# Patient Record
Sex: Female | Born: 1970 | Race: White | Hispanic: No | Marital: Married | Smoking: Former smoker
Health system: Southern US, Community
[De-identification: ages and names within clinical notes are randomized; demographics above are authoritative.]

## PROBLEM LIST (undated history)

## (undated) VITALS — BP 107/70 | HR 78 | Temp 98.4°F | Resp 16 | Ht 66.0 in | Wt 155.0 lb

## (undated) DIAGNOSIS — M25559 Pain in unspecified hip: Secondary | ICD-10-CM

## (undated) DIAGNOSIS — F329 Major depressive disorder, single episode, unspecified: Secondary | ICD-10-CM

## (undated) DIAGNOSIS — M5136 Other intervertebral disc degeneration, lumbar region: Secondary | ICD-10-CM

## (undated) DIAGNOSIS — F319 Bipolar disorder, unspecified: Secondary | ICD-10-CM

## (undated) DIAGNOSIS — F32A Depression, unspecified: Secondary | ICD-10-CM

## (undated) DIAGNOSIS — G8929 Other chronic pain: Secondary | ICD-10-CM

## (undated) HISTORY — PX: ABDOMINAL HYSTERECTOMY: SHX81

## (undated) HISTORY — PX: BACK SURGERY: SHX140

## (undated) HISTORY — DX: Other intervertebral disc degeneration, lumbar region: M51.36

## (undated) HISTORY — PX: OTHER SURGICAL HISTORY: SHX169

## (undated) HISTORY — PX: NO PAST SURGERIES: SHX2092

---

## 1997-11-22 ENCOUNTER — Emergency Department (HOSPITAL_COMMUNITY): Admission: EM | Admit: 1997-11-22 | Discharge: 1997-11-22 | Payer: Self-pay | Admitting: Emergency Medicine

## 1999-01-27 ENCOUNTER — Emergency Department (HOSPITAL_COMMUNITY): Admission: EM | Admit: 1999-01-27 | Discharge: 1999-01-27 | Payer: Self-pay | Admitting: Emergency Medicine

## 1999-03-31 ENCOUNTER — Emergency Department (HOSPITAL_COMMUNITY): Admission: EM | Admit: 1999-03-31 | Discharge: 1999-03-31 | Payer: Self-pay | Admitting: Emergency Medicine

## 1999-03-31 ENCOUNTER — Encounter: Payer: Self-pay | Admitting: *Deleted

## 1999-05-01 ENCOUNTER — Other Ambulatory Visit: Admission: RE | Admit: 1999-05-01 | Discharge: 1999-05-01 | Payer: Self-pay | Admitting: Obstetrics and Gynecology

## 1999-07-30 ENCOUNTER — Emergency Department (HOSPITAL_COMMUNITY): Admission: EM | Admit: 1999-07-30 | Discharge: 1999-07-30 | Payer: Self-pay | Admitting: Emergency Medicine

## 1999-12-08 ENCOUNTER — Emergency Department (HOSPITAL_COMMUNITY): Admission: EM | Admit: 1999-12-08 | Discharge: 1999-12-08 | Payer: Self-pay | Admitting: Emergency Medicine

## 1999-12-09 ENCOUNTER — Encounter: Payer: Self-pay | Admitting: Emergency Medicine

## 2000-05-21 ENCOUNTER — Other Ambulatory Visit: Admission: RE | Admit: 2000-05-21 | Discharge: 2000-05-21 | Payer: Self-pay | Admitting: Obstetrics and Gynecology

## 2001-09-23 ENCOUNTER — Emergency Department (HOSPITAL_COMMUNITY): Admission: EM | Admit: 2001-09-23 | Discharge: 2001-09-23 | Payer: Self-pay | Admitting: Emergency Medicine

## 2001-09-23 ENCOUNTER — Encounter: Payer: Self-pay | Admitting: Emergency Medicine

## 2001-10-09 ENCOUNTER — Other Ambulatory Visit: Admission: RE | Admit: 2001-10-09 | Discharge: 2001-10-09 | Payer: Self-pay | Admitting: Obstetrics and Gynecology

## 2004-07-03 ENCOUNTER — Other Ambulatory Visit: Admission: RE | Admit: 2004-07-03 | Discharge: 2004-07-03 | Payer: Self-pay | Admitting: Obstetrics and Gynecology

## 2004-10-21 ENCOUNTER — Emergency Department (HOSPITAL_COMMUNITY): Admission: EM | Admit: 2004-10-21 | Discharge: 2004-10-21 | Payer: Self-pay | Admitting: Emergency Medicine

## 2006-12-05 ENCOUNTER — Encounter: Admission: RE | Admit: 2006-12-05 | Discharge: 2006-12-05 | Payer: Self-pay | Admitting: Internal Medicine

## 2010-05-11 ENCOUNTER — Ambulatory Visit: Payer: Self-pay | Admitting: Obstetrics and Gynecology

## 2010-06-13 ENCOUNTER — Ambulatory Visit: Payer: Self-pay | Admitting: Unknown Physician Specialty

## 2010-06-27 ENCOUNTER — Ambulatory Visit: Payer: Self-pay | Admitting: Unknown Physician Specialty

## 2010-10-05 NOTE — Op Note (Signed)
[name], [name]                ACCOUNT NO.:  [id number]   MEDICAL RECORD NO.:  [id number]          PATIENT TYPE:  EMS   LOCATION:  MAJO                         FACILITY:  [facility]   PHYSICIAN:  John C. Madilyn Fireman, M.D.    DATE OF BIRTH:  17-Mar-1971   DATE OF PROCEDURE:  10/21/2004  DATE OF DISCHARGE:                                 OPERATIVE REPORT   PROCEDURES:  1.  esophagogastroduodenoscopy  2.  Esophageal dilatation.   INDICATIONS FOR PROCEDURE:  Sensation of esophageal obstruction after eating  steak.   DESCRIPTION OF PROCEDURE:  The patient was placed in the left lateral  decubitus position and placed on the pulse monitor with continuous low-flow  oxygen delivered by nasal cannula. She was sedated 50 mcg of IV fentanyl and  5 mg of IV Versed. The Olympus video endoscope was advanced under direct  vision into the oropharynx and esophagus. The esophagus was straight and of  normal caliber with the squamocolumnar line at 38 cm. There was no food seen  in the esophagus. There was a fairly patent lower esophageal ring. There was  no significant inflammation or stricture. The stomach was entered and a food  bolus consistent with a piece of steak was found in the dependent portions  of the fundus. Retroflexed view of the cardia was unremarkable. The fundus,  body, antrum and pylorus all appeared normal. The duodenum was entered both  bulb and second portion were well inspected and appeared to be within normal  limits. A Savary dilator was passed was passed through the endoscope channel  and the scope withdrawn. A single 17 mm Savary dilator was passed over the  guidewire with mild resistance and no blood seen on withdrawal. The patient  was then returned to the recovery room in stable condition. She tolerated  the procedure well and there were no immediate complications.   IMPRESSION:  Lower esophageal ring with evidence of previously impacted food  bolus having passed spontaneously  into the stomach, status post dilatation  to 17 mm.   PLAN:  Advance diet and observe for response to dilatation.      JCH/MEDQ  D:  10/21/2004  T:  10/21/2004  Job:  [id number]

## 2011-05-10 ENCOUNTER — Ambulatory Visit: Payer: Self-pay | Admitting: Obstetrics and Gynecology

## 2012-03-07 ENCOUNTER — Emergency Department: Payer: Self-pay | Admitting: Emergency Medicine

## 2012-03-07 LAB — CBC
MCH: 33.5 pg (ref 26.0–34.0)
MCV: 99 fL (ref 80–100)
Platelet: 283 10*3/uL (ref 150–440)
RBC: 3.95 10*6/uL (ref 3.80–5.20)
RDW: 13.4 % (ref 11.5–14.5)
WBC: 6.5 10*3/uL (ref 3.6–11.0)

## 2012-03-07 LAB — DRUG SCREEN, URINE
Amphetamines, Ur Screen: NEGATIVE (ref ?–1000)
Barbiturates, Ur Screen: NEGATIVE (ref ?–200)
Benzodiazepine, Ur Scrn: NEGATIVE (ref ?–200)
Cannabinoid 50 Ng, Ur ~~LOC~~: NEGATIVE (ref ?–50)
Methadone, Ur Screen: NEGATIVE (ref ?–300)
Opiate, Ur Screen: POSITIVE (ref ?–300)
Phencyclidine (PCP) Ur S: NEGATIVE (ref ?–25)

## 2012-03-07 LAB — COMPREHENSIVE METABOLIC PANEL
Albumin: 4.1 g/dL (ref 3.4–5.0)
Alkaline Phosphatase: 124 U/L (ref 50–136)
Anion Gap: 7 (ref 7–16)
BUN: 4 mg/dL — ABNORMAL LOW (ref 7–18)
Bilirubin,Total: 0.3 mg/dL (ref 0.2–1.0)
Chloride: 103 mmol/L (ref 98–107)
Creatinine: 0.85 mg/dL (ref 0.60–1.30)
EGFR (African American): >60
EGFR (Non-African Amer.): >60
Glucose: 80 mg/dL (ref 65–99)
Osmolality: 275 (ref 275–301)
SGOT(AST): 39 U/L — ABNORMAL HIGH (ref 15–37)
SGPT (ALT): 60 U/L (ref 12–78)
Sodium: 140 mmol/L (ref 136–145)
Total Protein: 7.9 g/dL (ref 6.4–8.2)

## 2012-03-07 LAB — URINALYSIS, COMPLETE
Bilirubin,UR: NEGATIVE
Glucose,UR: NEGATIVE mg/dL (ref 0–75)
Ketone: NEGATIVE
Leukocyte Esterase: NEGATIVE
RBC,UR: 1 /HPF (ref 0–5)
Squamous Epithelial: 1
WBC UR: <1 /HPF (ref 0–5)

## 2012-03-07 LAB — MAGNESIUM: Magnesium: 1.6 mg/dL — ABNORMAL LOW

## 2012-03-16 ENCOUNTER — Emergency Department: Payer: Self-pay | Admitting: Emergency Medicine

## 2012-03-16 LAB — URINALYSIS, COMPLETE
Ketone: NEGATIVE
Leukocyte Esterase: NEGATIVE
Nitrite: NEGATIVE
Ph: 6 (ref 4.5–8.0)
Protein: NEGATIVE
RBC,UR: <1 /HPF (ref 0–5)
WBC UR: 1 /HPF (ref 0–5)

## 2012-03-16 LAB — COMPREHENSIVE METABOLIC PANEL
Albumin: 4.7 g/dL (ref 3.4–5.0)
Alkaline Phosphatase: 129 U/L (ref 50–136)
Anion Gap: 7 (ref 7–16)
BUN: 10 mg/dL (ref 7–18)
Calcium, Total: 9.7 mg/dL (ref 8.5–10.1)
Co2: 30 mmol/L (ref 21–32)
Creatinine: 0.81 mg/dL (ref 0.60–1.30)
EGFR (African American): >60
EGFR (Non-African Amer.): >60
Glucose: 95 mg/dL (ref 65–99)
Osmolality: 276 (ref 275–301)
SGPT (ALT): 34 U/L (ref 12–78)

## 2012-03-16 LAB — CBC
HCT: 42.7 % (ref 35.0–47.0)
MCH: 34.5 pg — ABNORMAL HIGH (ref 26.0–34.0)
MCHC: 35.2 g/dL (ref 32.0–36.0)
MCV: 98 fL (ref 80–100)
RBC: 4.36 10*6/uL (ref 3.80–5.20)
RDW: 12.9 % (ref 11.5–14.5)

## 2012-03-16 LAB — DRUG SCREEN, URINE
Amphetamines, Ur Screen: NEGATIVE (ref ?–1000)
Cannabinoid 50 Ng, Ur ~~LOC~~: NEGATIVE (ref ?–50)
Cocaine Metabolite,Ur ~~LOC~~: NEGATIVE (ref ?–300)
MDMA (Ecstasy)Ur Screen: NEGATIVE (ref ?–500)
Phencyclidine (PCP) Ur S: NEGATIVE (ref ?–25)

## 2012-03-16 LAB — TSH: Thyroid Stimulating Horm: 0.93 u[IU]/mL

## 2012-03-16 LAB — ACETAMINOPHEN LEVEL: Acetaminophen: <2 ug/mL

## 2012-03-16 LAB — SALICYLATE LEVEL: Salicylates, Serum: <1.7 mg/dL

## 2012-03-17 ENCOUNTER — Emergency Department (HOSPITAL_COMMUNITY)
Admission: EM | Admit: 2012-03-17 | Discharge: 2012-03-18 | Disposition: A | Discharge status: Other Acute Inpatient Hospital | Payer: 59 | Attending: Emergency Medicine | Admitting: Emergency Medicine

## 2012-03-17 ENCOUNTER — Encounter (HOSPITAL_COMMUNITY): Payer: Self-pay | Admitting: Emergency Medicine

## 2012-03-17 DIAGNOSIS — Z79899 Other long term (current) drug therapy: Secondary | ICD-10-CM | POA: Insufficient documentation

## 2012-03-17 DIAGNOSIS — R45851 Suicidal ideations: Secondary | ICD-10-CM | POA: Insufficient documentation

## 2012-03-17 DIAGNOSIS — Z008 Encounter for other general examination: Secondary | ICD-10-CM | POA: Insufficient documentation

## 2012-03-17 DIAGNOSIS — M25559 Pain in unspecified hip: Secondary | ICD-10-CM | POA: Insufficient documentation

## 2012-03-17 DIAGNOSIS — F319 Bipolar disorder, unspecified: Secondary | ICD-10-CM | POA: Insufficient documentation

## 2012-03-17 DIAGNOSIS — M549 Dorsalgia, unspecified: Secondary | ICD-10-CM | POA: Insufficient documentation

## 2012-03-17 DIAGNOSIS — Z87891 Personal history of nicotine dependence: Secondary | ICD-10-CM | POA: Insufficient documentation

## 2012-03-17 DIAGNOSIS — G8929 Other chronic pain: Secondary | ICD-10-CM | POA: Insufficient documentation

## 2012-03-17 HISTORY — DX: Pain in unspecified hip: M25.559

## 2012-03-17 HISTORY — DX: Other chronic pain: G89.29

## 2012-03-17 LAB — COMPREHENSIVE METABOLIC PANEL
Albumin: 4.3 g/dL (ref 3.5–5.2)
BUN: 11 mg/dL (ref 6–23)
Calcium: 9.6 mg/dL (ref 8.4–10.5)
Chloride: 99 mEq/L (ref 96–112)
Creatinine, Ser: 0.72 mg/dL (ref 0.50–1.10)
Total Bilirubin: 0.4 mg/dL (ref 0.3–1.2)
Total Protein: 8 g/dL (ref 6.0–8.3)

## 2012-03-17 LAB — CBC
HCT: 43 % (ref 36.0–46.0)
MCH: 32.9 pg (ref 26.0–34.0)
MCHC: 34 g/dL (ref 30.0–36.0)
MCV: 96.8 fL (ref 78.0–100.0)
RDW: 12.7 % (ref 11.5–15.5)

## 2012-03-17 LAB — POCT PREGNANCY, URINE: Preg Test, Ur: NEGATIVE

## 2012-03-17 LAB — SALICYLATE LEVEL: Salicylate Lvl: <2 mg/dL — ABNORMAL LOW (ref 2.8–20.0)

## 2012-03-17 LAB — ETHANOL: Alcohol, Ethyl (B): <11 mg/dL (ref 0–11)

## 2012-03-17 LAB — RAPID URINE DRUG SCREEN, HOSP PERFORMED: Benzodiazepines: POSITIVE — AB

## 2012-03-17 MED ORDER — ACETAMINOPHEN 325 MG PO TABS
650.0000 mg | ORAL_TABLET | ORAL | Status: DC | PRN
  Administered 2012-03-18: 650 mg via ORAL
  Filled 2012-03-17: qty 2

## 2012-03-17 MED ORDER — LORAZEPAM 1 MG PO TABS
1.0000 mg | ORAL_TABLET | Freq: Once | ORAL | Status: AC
  Administered 2012-03-17: 1 mg via ORAL
  Filled 2012-03-17: qty 1

## 2012-03-17 MED ORDER — CLONAZEPAM 0.5 MG PO TABS
1.0000 mg | ORAL_TABLET | Freq: Every day | ORAL | Status: DC
  Administered 2012-03-17: 1 mg via ORAL
  Filled 2012-03-17: qty 2

## 2012-03-17 MED ORDER — LAMOTRIGINE 200 MG PO TABS
400.0000 mg | ORAL_TABLET | Freq: Every day | ORAL | Status: DC
  Administered 2012-03-18: 400 mg via ORAL
  Filled 2012-03-17 (×3): qty 2

## 2012-03-17 MED ORDER — ONDANSETRON 4 MG PO TBDP
4.0000 mg | ORAL_TABLET | Freq: Once | ORAL | Status: AC
  Administered 2012-03-17: 4 mg via ORAL
  Filled 2012-03-17: qty 1

## 2012-03-17 MED ORDER — ALUM & MAG HYDROXIDE-SIMETH 200-200-20 MG/5ML PO SUSP: 30.0000 mL | ORAL | Status: DC | PRN

## 2012-03-17 MED ORDER — ESCITALOPRAM OXALATE 10 MG PO TABS
10.0000 mg | ORAL_TABLET | Freq: Every day | ORAL | Status: DC
  Administered 2012-03-18: 10 mg via ORAL
  Filled 2012-03-17: qty 1

## 2012-03-17 MED ORDER — IBUPROFEN 200 MG PO TABS
600.0000 mg | ORAL_TABLET | Freq: Three times a day (TID) | ORAL | Status: DC | PRN
  Administered 2012-03-17: 600 mg via ORAL
  Filled 2012-03-17: qty 3

## 2012-03-17 MED ORDER — ZIPRASIDONE HCL 40 MG PO CAPS
40.0000 mg | ORAL_CAPSULE | Freq: Two times a day (BID) | ORAL | Status: DC
  Administered 2012-03-18: 40 mg via ORAL
  Filled 2012-03-17: qty 1

## 2012-03-17 MED ORDER — ZIPRASIDONE MESYLATE 20 MG IM SOLR: 20.0000 mg | Freq: Once | INTRAMUSCULAR | Status: DC

## 2012-03-17 MED ORDER — ONDANSETRON HCL 8 MG PO TABS: 4.0000 mg | ORAL_TABLET | Freq: Three times a day (TID) | ORAL | Status: DC | PRN

## 2012-03-17 MED ORDER — ZOLPIDEM TARTRATE 5 MG PO TABS
5.0000 mg | ORAL_TABLET | Freq: Every evening | ORAL | Status: DC | PRN
  Administered 2012-03-17: 5 mg via ORAL
  Filled 2012-03-17: qty 1

## 2012-03-17 MED ORDER — CLONAZEPAM 0.5 MG PO TABS
0.5000 mg | ORAL_TABLET | Freq: Two times a day (BID) | ORAL | Status: DC | PRN
  Administered 2012-03-18: 0.5 mg via ORAL
  Filled 2012-03-17: qty 1

## 2012-03-17 MED ORDER — ACETAMINOPHEN 325 MG PO TABS
975.0000 mg | ORAL_TABLET | Freq: Once | ORAL | Status: AC
  Administered 2012-03-17: 975 mg via ORAL
  Filled 2012-03-17: qty 3

## 2012-03-17 MED ORDER — TIZANIDINE HCL 2 MG PO TABS: 2.0000 mg | ORAL_TABLET | Freq: Three times a day (TID) | ORAL | Status: DC | PRN

## 2012-03-17 NOTE — ED Notes (Signed)
Pt placed in blue scrubs, security called to wand pt.

## 2012-03-17 NOTE — ED Notes (Signed)
Pt states that she feels anxious and nauseous, MD Kohut notified, orders received.

## 2012-03-17 NOTE — ED Notes (Signed)
[facility] called and made aware of suicidal pt. Family at bedside.

## 2012-03-17 NOTE — ED Notes (Signed)
Faxed  [facility]work

## 2012-03-17 NOTE — ED Notes (Signed)
Meal tray ordered from dietary as requested by patient

## 2012-03-17 NOTE — ED Provider Notes (Signed)
Psychiatry consultation report reviewed. Recommended inpatient  admission. Medications ordered per psychiatry recommendations.  Raeford Razor, MD 03/17/12 [phone]

## 2012-03-17 NOTE — ED Notes (Signed)
Pt reports she has had pyschotic issues for 16 years. For about a week the pt has been experiencing sadness and hopelessness, pt reports today she started to feel like giving up and wanted to harm herself. Pt reports she has a plan to hurt herself by slicing her arms open. Pt reports she has also thought about overdoses before too. Pt had a bad anxiety attack 2 weeks ago and had to go to ED.

## 2012-03-17 NOTE — ED Provider Notes (Signed)
History     CSN: [id number]  Arrival date & time 03/17/12  1254   First MD Initiated Contact with Patient 03/17/12 1326      Chief Complaint  Patient presents with  . Suicidal  . Medical Clearance    (Consider location/radiation/quality/duration/timing/severity/associated sxs/prior treatment) HPI  Jeanette Bell is a 41 y.o. female complaining of SI with plan to slit wrists. Denies HI, hallucinations, excessive EtOH use, illicit drug use. Pt has been taking effexor as prescribed   Past Medical History  Diagnosis Date  . Chronic hip pain     left hip. treated with radiofrequency    Past Surgical History  Procedure Date  . C cection     No family history on file.  History  Substance Use Topics  . Smoking status: Current Some Day Smoker -- 0.2 packs/day    Types: Cigarettes  . Smokeless tobacco: Not on file  . Alcohol Use: Yes     4-5 wine coolers or beer/week    OB History    Grav Para Term Preterm Abortions TAB SAB Ect Mult Living                  Review of Systems  Constitutional: Negative for fever.  Respiratory: Negative for shortness of breath.   Cardiovascular: Negative for chest pain.  Gastrointestinal: Negative for nausea, vomiting, abdominal pain and diarrhea.  Psychiatric/Behavioral: Positive for suicidal ideas.  All other systems reviewed and are negative.    Allergies  Review of patient's allergies indicates no known allergies.  Home Medications   Current Outpatient Rx  Name Route Sig Dispense Refill  . ACETAMINOPHEN 500 MG PO TABS Oral Take 1,000 mg by mouth every 6 (six) hours as needed. For pain    . CLONAZEPAM 1 MG PO TABS Oral Take 1 mg by mouth every 6 (six) hours as needed. For anxiety    . OXYCODONE-ACETAMINOPHEN 7.5-325 MG PO TABS Oral Take 1 tablet by mouth every 6 (six) hours as needed. For pain    . VENLAFAXINE HCL ER 150 MG PO CP24 Oral Take 150 mg by mouth daily.      BP 126/78  Temp 98.5 F (36.9 C) (Oral)  Wt 155 lb  (70.308 kg)  SpO2 98%  Physical Exam  Nursing note and vitals reviewed. Constitutional: She is oriented to person, place, and time. She appears well-developed and well-nourished. No distress.  HENT:  Head: Normocephalic.  Mouth/Throat: Oropharynx is clear and moist.  Eyes: Conjunctivae normal and EOM are normal. Pupils are equal, round, and reactive to light.  Cardiovascular: Normal rate, regular rhythm, normal heart sounds and intact distal pulses.   Pulmonary/Chest: Effort normal and breath sounds normal. No stridor.  Abdominal: Soft. Bowel sounds are normal.  Musculoskeletal: Normal range of motion.  Neurological: She is alert and oriented to person, place, and time.  Skin: Skin is warm.  Psychiatric: She has a normal mood and affect.    ED Course  Procedures (including critical care time)  Labs Reviewed  SALICYLATE LEVEL - Abnormal; Notable for the following:    Salicylate Lvl <2.0 (*)     All other components within normal limits  URINE RAPID DRUG SCREEN (HOSP PERFORMED) - Abnormal; Notable for the following:    Benzodiazepines POSITIVE (*)     All other components within normal limits  ACETAMINOPHEN LEVEL  CBC  COMPREHENSIVE METABOLIC PANEL  ETHANOL  POCT PREGNANCY, URINE   No results found.   1. Suicidal ideation  MDM  Patient is medically cleared for psychiatric evaluation I will consult the act team and obtain a telepsyche consult.        Wynetta Emery, PA-C 03/17/12 1636

## 2012-03-17 NOTE — ED Notes (Signed)
Pt c/o slight HA, requesting some tylenol. PA notified.

## 2012-03-17 NOTE — ED Notes (Signed)
Pod C RN unable to take pt at this time, said she will call when ready.

## 2012-03-17 NOTE — ED Notes (Signed)
Telepsych completed, pt tolerated well

## 2012-03-17 NOTE — ED Notes (Signed)
Pt's family reports pt was recently prescribed clonidine and took too many at once.

## 2012-03-18 ENCOUNTER — Inpatient Hospital Stay (HOSPITAL_COMMUNITY)
Admission: EM | Admit: 2012-03-18 | Discharge: 2012-03-21 | DRG: 885 | Disposition: A | Discharge status: Home / Self Care | Payer: 59 | Source: Ambulatory Visit | Attending: Psychiatry | Admitting: Psychiatry

## 2012-03-18 ENCOUNTER — Encounter (HOSPITAL_COMMUNITY): Payer: Self-pay | Admitting: Behavioral Health

## 2012-03-18 DIAGNOSIS — Z23 Encounter for immunization: Secondary | ICD-10-CM

## 2012-03-18 DIAGNOSIS — G8929 Other chronic pain: Secondary | ICD-10-CM | POA: Diagnosis present

## 2012-03-18 DIAGNOSIS — F319 Bipolar disorder, unspecified: Secondary | ICD-10-CM | POA: Diagnosis present

## 2012-03-18 DIAGNOSIS — M25559 Pain in unspecified hip: Secondary | ICD-10-CM | POA: Diagnosis present

## 2012-03-18 DIAGNOSIS — F3130 Bipolar disorder, current episode depressed, mild or moderate severity, unspecified: Principal | ICD-10-CM | POA: Diagnosis present

## 2012-03-18 DIAGNOSIS — Z79899 Other long term (current) drug therapy: Secondary | ICD-10-CM

## 2012-03-18 HISTORY — DX: Bipolar disorder, unspecified: F31.9

## 2012-03-18 HISTORY — DX: Major depressive disorder, single episode, unspecified: F32.9

## 2012-03-18 HISTORY — DX: Depression, unspecified: F32.A

## 2012-03-18 MED ORDER — ACETAMINOPHEN 500 MG PO TABS
1000.0000 mg | ORAL_TABLET | Freq: Four times a day (QID) | ORAL | Status: DC | PRN
  Administered 2012-03-18 – 2012-03-21 (×4): 1000 mg via ORAL
  Filled 2012-03-18 (×4): qty 2

## 2012-03-18 MED ORDER — VENLAFAXINE HCL 75 MG PO TABS
150.0000 mg | ORAL_TABLET | Freq: Every morning | ORAL | Status: DC
  Administered 2012-03-18 – 2012-03-20 (×3): 150 mg via ORAL
  Filled 2012-03-18 (×4): qty 2

## 2012-03-18 MED ORDER — LAMOTRIGINE 200 MG PO TABS
400.0000 mg | ORAL_TABLET | Freq: Every day | ORAL | Status: DC
  Administered 2012-03-19 – 2012-03-21 (×3): 400 mg via ORAL
  Filled 2012-03-18 (×6): qty 2

## 2012-03-18 MED ORDER — HYDROXYZINE HCL 10 MG PO TABS: 10.0000 mg | ORAL_TABLET | Freq: Four times a day (QID) | ORAL | Status: DC | PRN

## 2012-03-18 MED ORDER — HYDROXYZINE HCL 25 MG PO TABS
25.0000 mg | ORAL_TABLET | Freq: Four times a day (QID) | ORAL | Status: DC | PRN
  Administered 2012-03-19: 25 mg via ORAL
  Filled 2012-03-18 (×2): qty 12

## 2012-03-18 MED ORDER — INFLUENZA VIRUS VACC SPLIT PF IM SUSP
0.5000 mL | INTRAMUSCULAR | Status: AC
  Administered 2012-03-19: 0.5 mL via INTRAMUSCULAR

## 2012-03-18 MED ORDER — HYDROXYZINE HCL 50 MG PO TABS
50.0000 mg | ORAL_TABLET | Freq: Every evening | ORAL | Status: DC | PRN
  Administered 2012-03-18 – 2012-03-20 (×4): 50 mg via ORAL
  Filled 2012-03-18 (×10): qty 1

## 2012-03-18 MED ORDER — PNEUMOCOCCAL VAC POLYVALENT 25 MCG/0.5ML IJ INJ
0.5000 mL | INJECTION | INTRAMUSCULAR | Status: AC
  Administered 2012-03-19: 0.5 mL via INTRAMUSCULAR
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
## 2012-03-18 NOTE — ED Notes (Signed)
Pt. Stated all belongings are with Mother. Mother confirmed. No other belongings are with the Pt.

## 2012-03-18 NOTE — ED Notes (Signed)
Report received, assumed care.  

## 2012-03-18 NOTE — Progress Notes (Signed)
[facility] Group Notes:  (Counselor/Nursing/MHT/Case Management/Adjunct) 1:15-2:30 Emotion Regulation Group  03/18/2012 3:27 PM  Type of Therapy:  Group Therapy  Participation Level:  Active  Participation Quality:  Appropriate, Attentive and Sharing  Affect:  Appropriate and Depressed  Cognitive:  Alert and Appropriate  Insight:  Good  Engagement in Group:  Good  Engagement in Therapy:  Good  Modes of Intervention:  Education, Problem-solving and Support  Summary of Progress/Problems: Patients affect was depressed and tearful during group.  She shared openly during group about the circumstances surrounding her admission to the hospital.  Patient communicates that she was recently experiencing some problems with opioid dependence and as a result was forced to take a leave of absence from work.  She states that her husband is not supportive and that she feels alone.    Haydee Jabbour L 03/18/2012, 3:27 PM

## 2012-03-18 NOTE — ED Notes (Signed)
Pt resting in bed, no distress noted, sitter remains at bedside, pt watching TV.

## 2012-03-18 NOTE — ED Notes (Signed)
ACT at bedside 

## 2012-03-18 NOTE — Progress Notes (Signed)
Psychoeducational Group Note  Date:  03/18/2012 Time: 2000  Group Topic/Focus:  Wrap-Up Group:   The focus of this group is to help patients review their daily goal of treatment and discuss progress on daily workbooks.  Participation Level:  Active  Participation Quality:  Appropriate, Attentive, Sharing and Supportive  Affect:  Appropriate  Cognitive:  Appropriate  Insight:  Good  Engagement in Group:  Good  Additional Comments:  Patient shared that she had a positive experience when her husband came to visit today and she looks forward to discharge tomorrow.  Feiga Nadel, Newton Pigg 03/18/2012, 10:17 PM
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
## 2012-03-18 NOTE — ED Notes (Signed)
Irving Burton, ACT Team at bedside

## 2012-03-18 NOTE — BH Assessment (Signed)
[facility] Assessment Progress Note      Pt has been accepted to Dr. Daleen Bo for inpatient psychiatric care.  All support paperwork completed and faxed to [facility].  [facility] pre-certification completed and authorization obtained.   Dr. Jeraldine Loots and nursing staff notified and agreeable with disposition.  Awaiting transfer.

## 2012-03-19 NOTE — Progress Notes (Signed)
D: Patient denies SI/HI and A/V hallucinations; patient reports that she already sees an improvement in her mood; patient reports sleep to be fair; reports no pain and has no complaints at this time  A: Monitored q 15 minutes; patient encouraged to attend group;; patient given medications per physician orders or any prn medications if needed; patient encouraged to express feelings and/or concerns  R: Patient is bright and animated.; patient's interaction with staff and peers is appropriate;  patient is taking medications as prescribed and tolerating medications; patient has attended group today

## 2012-03-19 NOTE — Progress Notes (Addendum)
[facility] Group Notes:  (Counselor/Nursing/MHT/Case Management/Adjunct)  03/19/2012 2:59 PM  Type of Therapy:  Group Therapy  Participation Level:  Active  Participation Quality:  Appropriate, Attentive, Sharing and Supportive  Affect:  Appropriate  Cognitive:  Alert and Appropriate  Insight:  Good  Engagement in Group:  Good  Engagement in Therapy:  Good  Modes of Intervention:  Education, Problem-solving, Support and Exploration  Summary of Progress/Problems:  Monchel was easily engaged in group.  She listened attentively to speaker from [facility] and asked questions following the presentation.  She was able to shared the need for her family to share in a more balanced life and that the presentation had been very empowering.  She shared she wants to get her daughter into a support group.  Pier attended discharge planning group and stated she is much better today.  She advised that husband visited last night and is making an effort to understand her illness.  She denied SI/HI and rated depression at zero and anxiety at three.  Wynn Banker 03/19/2012, 2:59 PM

## 2012-03-19 NOTE — Progress Notes (Signed)
D:  Patient self inventory sheet, patient sleeps well, has improving appetite, normal energy level, good attention span.  Rated depression #3, hopelessness #2.   Denied withdrawals.  Denied SI, contracts for safety.  Has experienced headache in past 24 hours.  After discharge, plans to "eat better and take care of myself".  No questions for staff.  Does have discharge plans.  No problems buying meds after discharge. A:  Medications per MD order.  Support and encouragement given throughout day.  Support and safety checks completed as ordered.    R:  Following treatment plan.  Denied SI and HI.  Denied A/V hallucinations.  Denied pain.  Patient remains safe and receptive on unit.  Patient has been cooperative and pleasant.

## 2012-03-19 NOTE — ED Provider Notes (Signed)
Medical screening examination/treatment/procedure(s) were performed by non-physician practitioner and as supervising physician I was immediately available for consultation/collaboration.   Gwyneth Sprout, MD 03/19/12 2049

## 2012-03-19 NOTE — Tx Team (Signed)
Initial Interdisciplinary Treatment Plan  PATIENT STRENGTHS: (choose at least two) Ability for insight Communication skills Financial means General fund of knowledge  PATIENT STRESSORS: Marital or family conflict Occupational concerns   PROBLEM LIST: Problem List/Patient Goals Date to be addressed Date deferred Reason deferred Estimated date of resolution  Suicidal Ideations      Depression                                                 DISCHARGE CRITERIA:  Ability to meet basic life and health needs Need for constant or close observation no longer present Reduction of life-threatening or endangering symptoms to within safe limits  PRELIMINARY DISCHARGE PLAN: Attend aftercare/continuing care group Outpatient therapy  PATIENT/FAMIILY INVOLVEMENT: This treatment plan has been presented to and reviewed with the patient, Jeanette Bell, and/or family member, .  The patient and family have been given the opportunity to ask questions and make suggestions.  Jeanette Bell 03/19/2012, 2:38 PM

## 2012-03-19 NOTE — Progress Notes (Signed)
Psychoeducational Group Note  Date:  03/19/2012 Time: 1100  Group Topic/Focus:  Rediscovering Joy:   The focus of this group is to explore various ways to relieve stress in a positive manner.  Participation Level:  Active  Participation Quality:  Appropriate, Attentive and Sharing  Affect:  Appropriate  Cognitive:  Appropriate  Insight:  Good  Engagement in Group:  Good  Additional Comments: Patient was able to explain different ways humor and laughter have positive benefits for both mind and body. Patient was able to identify one thing that is pleasurable to patient within the five senses in a category and use the information as coping skills when needed.   Karleen Hampshire Brittini 03/19/2012, 3:07 PM

## 2012-03-19 NOTE — Progress Notes (Signed)
Nutrition Note  Reason: MST score of 2  Patient reported poor PO intake PTA. She reported she was eating 1 meal daily. She stated she just did not feel like eating. She reported a UBW of 175 lb one and a half months ago. She reported she did not eat that well yesterday, but today she has eaten all her meals.   Wt Readings from Last 10 Encounters:  03/18/12 173 lb (78.472 kg)   I have encouraged the patient to eat 3 regular meals daily. She reported she thinks she is going to be doing better with her PO intake now. She was without any nutrition related questions.   RD available for nutrition needs.   Iven Finn [facility] [phone]

## 2012-03-19 NOTE — Progress Notes (Signed)
Psychoeducational Group Note  Date:  03/19/2012 Time:  1000  Group Topic/Focus:  Wellness Toolbox:   The focus of this group is to discuss various aspects of wellness, balancing those aspects and exploring ways to increase the ability to experience wellness.  Patients will create a wellness toolbox for use upon discharge.  Participation Level:  Active  Participation Quality:  Appropriate, Sharing and Supportive  Affect:  Appropriate  Cognitive:  Alert and Appropriate  Insight:  Good  Engagement in Group:  Good  Additional Comments:  Productive group  Earline Mayotte 03/19/2012, 6:13 PM

## 2012-03-19 NOTE — Progress Notes (Signed)
Psychoeducational Group Note  Date:  03/19/2012 Time:  2100   Group Topic/Focus:  Karaoke   Participation Level:  Active  Participation Quality:  Appropriate  Affect:  Appropriate  Cognitive:  Appropriate  Insight:  Good  Engagement in Group:  Good  Additional Comments:    Jeanette Bell [name] 03/19/2012, 10:00 PM

## 2012-03-19 NOTE — Progress Notes (Signed)
[facility] MD Progress Note  03/19/2012 10:26 AM Jeanette Bell  MRN:  [id number] S: Patient see, reports feeling better, not  suicidal. Husband visited her yesterday, they had a good talk. She also talked to all her children. 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 Since she quit taking them a week ago, denies any current withdrawal symptoms.  Diagnosis:   Axis I: Bipolar, Depressed Axis II: No diagnosis Axis III:  Past Medical History  Diagnosis Date  . Chronic hip pain     left hip. treated with radiofrequency  . Depression   . Bipolar 1 disorder, depressed 03/18/2012   Axis IV: economic problems and occupational problems Axis V: 51-60 moderate symptoms  ADL's:  Intact  Sleep: Fair  Appetite:  Fair   Mental Status Examination/Evaluation: Objective:  Appearance: Casual  Eye Contact::  Good  Speech:  Clear and Coherent  Volume:  Normal  Mood:  Anxious  Affect:  Appropriate  Thought Process:  Coherent  Orientation:  Full  Thought Content:  WDL  Suicidal Thoughts:  No  Homicidal Thoughts:  No  Memory:  Immediate;   Fair Recent;   Fair Remote;   Fair  Judgement:  Fair  Insight:  Fair  Psychomotor Activity:  Normal  Concentration:  Fair  Recall:  Fair  Akathisia:  No  Handed:  Right  AIMS (if indicated):     Assets:  Communication Skills Desire for Improvement Housing Social Support  Sleep:  Number of Hours: 6.5    Vital Signs:Blood pressure 103/69, pulse 79, temperature 98.1 F (36.7 C), temperature source Oral, resp. rate 16, height 5\' 6"  (1.676 m), weight 70.308 kg (155 lb). Current Medications: Current Facility-Administered Medications  Medication Dose Route Frequency Provider Last Rate Last Dose  . acetaminophen (TYLENOL) tablet 1,000 mg  1,000 mg Oral Q6H PRN Lilliahna Schubring, MD   1,000 mg at 03/18/12 2114  . hydrOXYzine (ATARAX/VISTARIL) tablet 25 mg  25 mg Oral Q6H PRN Kerry Hough, PA      . hydrOXYzine  (ATARAX/VISTARIL) tablet 50 mg  50 mg Oral QHS,MR X 1 Kerry Hough, PA   50 mg at 03/18/12 2301  . influenza  inactive virus vaccine (FLUZONE/FLUARIX) injection 0.5 mL  0.5 mL Intramuscular Tomorrow-1000 Simisola Sandles, MD   0.5 mL at 03/19/12 0947  . lamoTRIgine (LAMICTAL) tablet 400 mg  400 mg Oral Daily Preslee Regas, MD   400 mg at 03/19/12 0802  . pneumococcal 23 valent vaccine (PNU-IMMUNE) injection 0.5 mL  0.5 mL Intramuscular Tomorrow-1000 Janissa Bertram, MD   0.5 mL at 03/19/12 0948  . venlafaxine [facility]) tablet 150 mg  150 mg Oral q morning - 10a Mildred Tuccillo, MD   150 mg at 03/19/12 0950  . DISCONTD: hydrOXYzine (ATARAX/VISTARIL) tablet 10 mg  10 mg Oral QID PRN Patrick North, MD       Facility-Administered Medications Ordered in Other Encounters  Medication Dose Route Frequency Provider Last Rate Last Dose  . DISCONTD: acetaminophen (TYLENOL) tablet 650 mg  650 mg Oral Q4H PRN Raeford Razor, MD   650 mg at 03/18/12 4098  . DISCONTD: alum & mag hydroxide-simeth (MAALOX/MYLANTA) 200-200-20 MG/5ML suspension 30 mL  30 mL Oral PRN Raeford Razor, MD      . DISCONTD: clonazePAM Scarlette Calico) tablet 0.5 mg  0.5 mg Oral BID PRN Raeford Razor, MD   0.5 mg at 03/18/12 0110  . DISCONTD: clonazePAM (KLONOPIN) tablet 1 mg  1 mg Oral QHS Jeannett Senior  Kohut, MD   1 mg at 03/17/12 2201  . DISCONTD: escitalopram (LEXAPRO) tablet 10 mg  10 mg Oral Daily Raeford Razor, MD   10 mg at 03/18/12 0830  . DISCONTD: ibuprofen (ADVIL,MOTRIN) tablet 600 mg  600 mg Oral Q8H PRN Raeford Razor, MD   600 mg at 03/17/12 1912  . DISCONTD: lamoTRIgine (LAMICTAL) tablet 400 mg  400 mg Oral Daily Raeford Razor, MD   400 mg at 03/18/12 6578  . DISCONTD: ondansetron (ZOFRAN) tablet 4 mg  4 mg Oral Q8H PRN Raeford Razor, MD      . DISCONTD: tiZANidine (ZANAFLEX) tablet 2 mg  2 mg Oral TID PRN Raeford Razor, MD      . DISCONTD: ziprasidone (GEODON) capsule 40 mg  40 mg Oral BID WC Raeford Razor, MD   40 mg at 03/18/12 4696    . DISCONTD: zolpidem (AMBIEN) tablet 5 mg  5 mg Oral QHS PRN Raeford Razor, MD   5 mg at 03/17/12 2201    Lab Results:  Results for orders placed during the hospital encounter of 03/17/12 (from the past 48 hour(s))  URINE RAPID DRUG SCREEN (HOSP PERFORMED)     Status: Abnormal   Collection Time   03/17/12  1:31 PM      Component Value Range Comment   Opiates NONE DETECTED  NONE DETECTED    Cocaine NONE DETECTED  NONE DETECTED    Benzodiazepines POSITIVE (*) NONE DETECTED    Amphetamines NONE DETECTED  NONE DETECTED    Tetrahydrocannabinol NONE DETECTED  NONE DETECTED    Barbiturates NONE DETECTED  NONE DETECTED   POCT PREGNANCY, URINE     Status: Normal   Collection Time   03/17/12  1:34 PM      Component Value Range Comment   Preg Test, Ur NEGATIVE  NEGATIVE   ACETAMINOPHEN LEVEL     Status: Normal   Collection Time   03/17/12  1:44 PM      Component Value Range Comment   Acetaminophen (Tylenol), Serum <15.0  10 - 30 ug/mL   CBC     Status: Normal   Collection Time   03/17/12  1:44 PM      Component Value Range Comment   WBC 8.4  4.0 - 10.5 K/uL    RBC 4.44  3.87 - 5.11 MIL/uL    Hemoglobin 14.6  12.0 - 15.0 g/dL    HCT 29.5  28.4 - 13.2 %    MCV 96.8  78.0 - 100.0 fL    MCH 32.9  26.0 - 34.0 pg    MCHC 34.0  30.0 - 36.0 g/dL    RDW 44.0  10.2 - 72.5 %    Platelets 280  150 - 400 K/uL   COMPREHENSIVE METABOLIC PANEL     Status: Normal   Collection Time   03/17/12  1:44 PM      Component Value Range Comment   Sodium 135  135 - 145 mEq/L    Potassium 4.6  3.5 - 5.1 mEq/L    Chloride 99  96 - 112 mEq/L    CO2 28  19 - 32 mEq/L    Glucose, Bld 89  70 - 99 mg/dL    BUN 11  6 - 23 mg/dL    Creatinine, Ser 3.66  0.50 - 1.10 mg/dL    Calcium 9.6  8.4 - 44.0 mg/dL    Total Protein 8.0  6.0 - 8.3 g/dL    Albumin 4.3  3.5 -  5.2 g/dL    AST 23  0 - 37 U/L    ALT 26  0 - 35 U/L    Alkaline Phosphatase 104  39 - 117 U/L    Total Bilirubin 0.4  0.3 - 1.2 mg/dL    GFR calc  non Af Amer >90  >90 mL/min    GFR calc Af Amer >90  >90 mL/min   ETHANOL     Status: Normal   Collection Time   03/17/12  1:44 PM      Component Value Range Comment   Alcohol, Ethyl (B) <11  0 - 11 mg/dL   SALICYLATE LEVEL     Status: Abnormal   Collection Time   03/17/12  1:44 PM      Component Value Range Comment   Salicylate Lvl <2.0 (*) 2.8 - 20.0 mg/dL     Physical Findings: AIMS: Facial and Oral Movements Muscles of Facial Expression: None, normal Lips and Perioral Area: None, normal Jaw: None, normal Tongue: None, normal,Extremity Movements Upper (arms, wrists, hands, fingers): None, normal Lower (legs, knees, ankles, toes): None, normal, Trunk Movements Neck, shoulders, hips: None, normal, Overall Severity Severity of abnormal movements (highest score from questions above): None, normal Incapacitation due to abnormal movements: None, normal Patient's awareness of abnormal movements (rate only patient's report): No Awareness, Dental Status Current problems with teeth and/or dentures?: No Does patient usually wear dentures?: No  CIWA:  CIWA-Ar Total: 0  COWS:  COWS Total Score: 0   Treatment Plan Summary: Daily contact with patient to assess and evaluate symptoms and progress in treatment Medication management  Plan: Continue current plan of care. Patient reports that the combination of Effexor and Lamictal works best for her. Encourage group participation and learning coping skills to deal with stress and pain.  Littleton Haub 03/19/2012, 10:26 AM

## 2012-03-20 DIAGNOSIS — F3160 Bipolar disorder, current episode mixed, unspecified: Secondary | ICD-10-CM

## 2012-03-20 MED ORDER — VENLAFAXINE HCL 75 MG PO TABS
75.0000 mg | ORAL_TABLET | Freq: Every day | ORAL | Status: DC
  Administered 2012-03-21: 75 mg via ORAL
  Filled 2012-03-20 (×3): qty 1

## 2012-03-20 NOTE — Progress Notes (Signed)
Psychoeducational Group Note  Date:  03/20/2012 Time:  1100  Group Topic/Focus:  Early Warning Signs:   The focus of this group is to help patients identify signs or symptoms they exhibit before slipping into an unhealthy state or crisis.  Participation Level:  Active  Participation Quality:  Attentive, Intrusive and Monopolizing  Affect:  Anxious  Cognitive:  Alert and Oriented  Insight:  Good  Engagement in Group:  Good  Additional Comments:  Patient attempted to advise peers, easily redirected.   Noah Charon 03/20/2012, 2:10 PM

## 2012-03-20 NOTE — Progress Notes (Signed)
[facility] Case Management Discharge Plan:  Will you be returning to the same living situation after discharge: Yes,  Patient will be returning to her home. At discharge, do you have transportation home?:Yes,  Patients family will transport. Do you have the ability to pay for your medications:Yes,  Patient has insurance.  Interagency Information:     Release of information consent forms completed and in the chart;  Patient's signature needed at discharge.  Patient to Follow up at:  Follow-up Information    Follow up with [facility]. On 03/24/2012. (Patient has an appointment with Minda Ditto on Tuesday, November 5,2013 at 11:30 am. )    Contact information:   [address]  [place], [place] [address]  [phone]         Patient denies SI/HI:   Yes,     patient denies SI/HI or other thoughts of self-harm.   Safety Planning and Suicide Prevention discussed:  Yes, suicide prevention discussed during D/C planning group.  Barrier to discharge identified:Yes,  limited social support.  Summary and Recommendations: Patient encouraged to be compliant with medications and to follow up with all outpatient recommendations.    Bournes, Roshelle L 03/20/2012, 10:35 AM

## 2012-03-20 NOTE — Tx Team (Signed)
Interdisciplinary Treatment Plan Update (Adult)  Date:  03/20/2012  Time Reviewed:  9:54 AM   Progress in Treatment: Attending groups:   Yes   Participating in groups:  Yes Taking medication as prescribed:  Yes Tolerating medication:  Yes Family/Significant othe contact made: Contact made with family Patient understands diagnosis:  Yes Discussing patient identified problems/goals with staff: Yes Medical problems stabilized or resolved: Yes Denies suicidal/homicidal ideation:Yes Issues/concerns per patient self-inventory:  Other:  New problem(s) identified:  Reason for Continuation of Hospitalization: Anxiety Depression Medication stabilization   Interventions implemented related to continuation of hospitalization:  Additional comments:  Estimated length of stay: one day  Discharge Plan:  Home with outpatient follow up  New goal(s):  Review of initial/current patient goals per problem list:    1.  Goal(s): Eliminate SI/other thoughts of self harm   Met:  Yes  Target date: d/c  As evidenced by: Patient no longer endorsing SI/HI or other thoughts of self harm.    2.  Goal (s):Reduce depression/anxiety (rated at seven and six today)  Met:  Yes  Target date: d/c  As evidenced by: Patient will rate  symptoms at four or below    3.  Goal(s):.stabilize on meds   Met:  Yes  Target date: d/c  As evidenced by: Patient reports being stabilized on medications - less symptomatic    4.  Goal(s): Refer for outpatient follow up   Met:  Yes  Target date: d/c  As evidenced by: Follow up appointment scheduled    Attendees: Patient:  Jeanette Bell 03/20/2012 9:54 AM  Physican:  Patrick North, MD 03/20/2012 9:54 AM  Nursing:   03/20/2012 9:54 AM   Nursing:   Harold Barban, RN 03/20/2012 9:54 AM   Clinical Social Worker:  Juline Patch, LCSW 03/20/2012 9:54 AM   Other: Teddy Spike, MSW Intern 03/20/2012 9:54 AM

## 2012-03-20 NOTE — Progress Notes (Signed)
[facility] Group Notes:  (Counselor/Nursing/MHT/Case Management/Adjunct)   Relapse Prevention 1:15-2:30   Discharge Planning Group 8:30-9:30   03/20/2012 3:15 PM  Type of Therapy:  Group Therapy  Participation Level:  Active  Participation Quality:  Appropriate, Attentive and Supportive  Affect:  Appropriate  Cognitive:  Alert, Appropriate and Oriented  Insight:  Good  Engagement in Group:  Good  Engagement in Therapy:  Good  Modes of Intervention:  Education, Problem-solving and Support  Summary of Progress/Problems:  Patient was very positive and easily engaged during groups.   Patient discussed ways to recognize potential relapse triggers and methods of avoiding them.  D/C Planning Group: Patient denies endorsing SI/HI. She rates depression at seven and anxiety at six.  She advised of looking forward to discharging home today or tomorrow at the latest.

## 2012-03-20 NOTE — Progress Notes (Signed)
[facility] MD Progress Note  03/20/2012 9:57 AM Jeanette Bell  MRN:  [id number]  S: Patient seen in treatment team. She has high energy, feeling giddy, states she does feel hypomanic. Denies suicidal ideations.   Diagnosis:   Axis I: Bipolar, mixed Axis II: No diagnosis Axis III:  Past Medical History  Diagnosis Date  . Chronic hip pain     left hip. treated with radiofrequency  . Depression   . Bipolar 1 disorder, depressed 03/18/2012   Axis IV: occupational problems Axis V: 51-60 moderate symptoms  ADL's:  Intact  Sleep: Fair  Appetite:  Fair    Mental Status Examination/Evaluation: Objective:  Appearance: Disheveled  Eye Contact::  Good  Speech:  Clear and Coherent  Volume:  Increased  Mood:  Euphoric  Affect:  Labile  Thought Process:  Coherent  Orientation:  Full  Thought Content:  WDL  Suicidal Thoughts:  No  Homicidal Thoughts:  No  Memory:  Immediate;   Fair Recent;   Fair Remote;   Fair  Judgement:  Fair  Insight:  Fair  Psychomotor Activity:  Normal  Concentration:  Fair  Recall:  Fair  Akathisia:  No  Handed:  Right  AIMS (if indicated):     Assets:  Communication Skills Desire for Improvement  Sleep:  Number of Hours: 5.5    Vital Signs:Blood pressure 125/74, pulse 93, temperature 98.6 F (37 C), temperature source Oral, resp. rate 16, height 5\' 6"  (1.676 m), weight 70.308 kg (155 lb). Current Medications: Current Facility-Administered Medications  Medication Dose Route Frequency Provider Last Rate Last Dose  . acetaminophen (TYLENOL) tablet 1,000 mg  1,000 mg Oral Q6H PRN Micai Apolinar, MD   1,000 mg at 03/19/12 1508  . hydrOXYzine (ATARAX/VISTARIL) tablet 25 mg  25 mg Oral Q6H PRN Kerry Hough, PA   25 mg at 03/19/12 1158  . hydrOXYzine (ATARAX/VISTARIL) tablet 50 mg  50 mg Oral QHS,MR X 1 Kerry Hough, PA   50 mg at 03/19/12 2239  . lamoTRIgine (LAMICTAL) tablet 400 mg  400 mg Oral Daily Antionetta Ator, MD   400 mg at 03/20/12 0739  .  venlafaxine [facility]) tablet 150 mg  150 mg Oral q morning - 10a Porsha Skilton, MD   150 mg at 03/20/12 0941    Lab Results: No results found for this or any previous visit (from the past 48 hour(s)).  Physical Findings: AIMS: Facial and Oral Movements Muscles of Facial Expression: None, normal Lips and Perioral Area: None, normal Jaw: None, normal Tongue: None, normal,Extremity Movements Upper (arms, wrists, hands, fingers): None, normal Lower (legs, knees, ankles, toes): None, normal, Trunk Movements Neck, shoulders, hips: None, normal, Overall Severity Severity of abnormal movements (highest score from questions above): None, normal Incapacitation due to abnormal movements: None, normal Patient's awareness of abnormal movements (rate only patient's report): No Awareness, Dental Status Current problems with teeth and/or dentures?: No Does patient usually wear dentures?: No  CIWA:  CIWA-Ar Total: 0  COWS:  COWS Total Score: 0   Treatment Plan Summary: Daily contact with patient to assess and evaluate symptoms and progress in treatment Medication management  Plan: Decrease Effexor to 75mg  po qd to target manic symptoms. Patient to follow up with IOP program. Will plan for discharge tomorrow.  Denelle Capurro 03/20/2012, 9:57 AM

## 2012-03-20 NOTE — Progress Notes (Signed)
Patient ID: Jeanette Bell, female   DOB: 08/23/1970, 41 y.o.   MRN: [id number]  Problem: Bipolar Disorder  D: Patient interacting well with peers in milieu, no inappropriate behaviors noted.  A: Monitor Q 15 minutes for safety, encourage staff/peer interaction and group participation. Administer medications as ordered by MD.   R: Patient compliant with medications, denies SI or plans to harm herself at this time.

## 2012-03-20 NOTE — Progress Notes (Signed)
D: Patient appropriate and cooperative with staff and peers. She reported on self inventory sheet that she slept well, energy level is normal, and ability to pay attention is good. Patient rated depression and feelings of hopelessness "1". Patient reported that a change she plans to make to better care for self is to take more time for herself.  A: Support and encouragement provided to patient. Scheduled medications administered per MD orders. Maintain 15 minute checks.   R: Patient receptive and remains safe on the unit. Denies SI/HI and AVH.

## 2012-03-20 NOTE — Progress Notes (Signed)
[facility] Adult Inpatient Family/Significant Other Suicide Prevention Education  Suicide Prevention Education:  Education Completed; Vernie Murders, Mother, [phone]) has been identified by the patient as the family member/significant other with whom the patient will be residing, and identified as the person(s) who will aid the patient in the event of a mental health crisis (suicidal ideations/suicide attempt).  With written consent from the patient, the family member/significant other has been provided the following suicide prevention education, prior to the and/or following the discharge of the patient.  The suicide prevention education provided includes the following:  Suicide risk factors  Suicide prevention and interventions  National Suicide Hotline telephone number  [facility] assessment telephone number  [facility] Emergency Assistance 911  [facility] and/or Residential Mobile Crisis Unit telephone number  Request made of family/significant other to:  Remove weapons (e.g., guns, rifles, knives), all items previously/currently identified as safety concern.   Husband, Innocence Schlotzhauer, secured gun.  Remove drugs/medications (over-the-counter, prescriptions, illicit drugs), all items previously/currently identified as a safety concern.  The family member/significant other verbalizes understanding of the suicide prevention education information provided.  The family member/significant other agrees to remove the items of safety concern listed above.  Wynn Banker 03/20/2012, 11:45 AM

## 2012-03-21 DIAGNOSIS — F3130 Bipolar disorder, current episode depressed, mild or moderate severity, unspecified: Principal | ICD-10-CM

## 2012-03-21 MED ORDER — HYDROXYZINE HCL 25 MG PO TABS: 25.0000 mg | ORAL_TABLET | Freq: Four times a day (QID) | ORAL | Status: DC | PRN

## 2012-03-21 MED ORDER — LAMOTRIGINE 200 MG PO TABS: 400.0000 mg | ORAL_TABLET | Freq: Every day | ORAL | Status: DC

## 2012-03-21 MED ORDER — VENLAFAXINE HCL 75 MG PO TABS: 75.0000 mg | ORAL_TABLET | Freq: Every day | ORAL | Status: DC

## 2012-03-21 MED ORDER — HYDROXYZINE HCL 50 MG PO TABS: 50.0000 mg | ORAL_TABLET | Freq: Every evening | ORAL | Status: DC | PRN

## 2012-03-21 MED ORDER — VENLAFAXINE HCL 75 MG PO TABS
75.0000 mg | ORAL_TABLET | Freq: Every day | ORAL | Status: DC
  Filled 2012-03-21: qty 7

## 2012-03-21 NOTE — Progress Notes (Signed)
Psychoeducational Group Note  Date: 03/21/2012 Time:  1015  Group Topic/Focus:  Identifying Needs:   The focus of this group is to help patients identify their personal needs that have been historically problematic and identify healthy behaviors to address their needs.  Participation Level:  Active  Participation Quality:  Appropriate  Affect:  Appropriate  Cognitive:  Alert  Insight:  Good  Engagement in Group:  Good  Additional Comments:  Participated in the group sharing her struggles and how she intends to be aware of her new coping skills  Dione Housekeeper

## 2012-03-21 NOTE — Progress Notes (Signed)
Patient ID: Jeanette Bell, female   DOB: [birth year], 41 y.o.   MRN: [id number]  Subjective: Patient expresses readiness for discharge. Denies any suicidal or homicidal ideation. Denies any auditory or visual hallucinations. Plans to attend the Chemical Dependency Intensive Outpatient Program at [facility] outpatient clinic in [place].  Objective: Well-nourished well-developed white female in no acute distress, who is fully alert and oriented. Fairly groomed and casually dressed. Euthymic in hypomanic in mood. No outward signs of suicidality homicidality or psychosis.  Assessment and Plan: Prepared for discharge and followup plan in place.

## 2012-03-21 NOTE — Progress Notes (Signed)
GOALS GROUP Attended the morning group and was able to set a goal for herself of sitting and talking with her family when she gets home today. Wants to explain to them what happened and is prepared to listen to how they feel. Was given her evaluation and she denies SI and HI; rates her depression and hopelessness both at a 0. Participated fully in the group

## 2012-03-21 NOTE — [facility] Suicide Risk Assessment (Signed)
Suicide Risk Assessment  Discharge Assessment     Demographic Factors:  Caucasian  Mental Status Per Nursing Assessment::   On Admission:  Suicide plan;Plan includes specific time, place, or method;Intention to act on suicide plan  Current Mental Status by Physician: NA  Loss Factors: NA  Historical Factors: Impulsivity  Risk Reduction Factors:   Sense of responsibility to family  Continued Clinical Symptoms:  Depression:   Hopelessness  Cognitive Features That Contribute To Risk:  Closed-mindedness    Suicide Risk:  Minimal: No identifiable suicidal ideation.  Patients presenting with no risk factors but with morbid ruminations; may be classified as minimal risk based on the severity of the depressive symptoms  Discharge Diagnoses:   AXIS I: Bipolar, mixed  AXIS II:  Deferred AXIS III:   Past Medical History  Diagnosis Date  . Chronic hip pain     left hip. treated with radiofrequency  . Depression   . Bipolar 1 disorder, depressed 03/18/2012   AXIS IV:  other psychosocial or environmental problems AXIS V:  51-60 moderate symptoms  Plan Of Care/Follow-up recommendations:  Pt will be given info about it including IOP  Is patient on multiple antipsychotic therapies at discharge:  No   Has Patient had three or more failed trials of antipsychotic monotherapy by history:  No  Recommended Plan for Multiple Antipsychotic Therapies: Additional reason(s) for multiple antispychotic treatment:  n/a  Wonda Cerise 03/21/2012, 11:01 AM

## 2012-03-21 NOTE — Progress Notes (Signed)
D) Pt being discharged to home accompanied by family member. Denies SI and HI. Rates his depression and hopelessness both at a 0. Denies SI and HI. A) all belongings returned to Pt. Given support and reassurance along with praise. Follow ups and medications explained to Pt. Pt was able to recite back the teach back 3 to this Clinical research associate. R) Denies SI and HI. Plans to follow up outpatient and take the medication as prescribed.

## 2012-03-21 NOTE — Progress Notes (Signed)
Psychoeducational Group Note  Date:  03/21/2012 Time:  2000  Group Topic/Focus:  Wrap-Up Group:   The focus of this group is to help patients review their daily goal of treatment and discuss progress on daily workbooks.  Participation Level:  Active  Participation Quality:  Appropriate  Affect:  Appropriate  Cognitive:  Appropriate  Insight:  Good  Engagement in Group:  Good  Additional Comments:  Pt participated in wrap-up group this evening.  Javae Braaten A 03/21/2012, 5:41 AM

## 2012-03-23 NOTE — Progress Notes (Signed)
Patient Discharge Instructions:  After Visit Summary (AVS):   Faxed to:  03/23/2012 Psychiatric Admission Assessment Note:   Faxed to:  03/23/2012 Suicide Risk Assessment - Discharge Assessment:   Faxed to:  03/23/2012 Faxed/Sent to the Next Level Care provider:  03/23/2012  Faxed to [facility] @ [phone]   Wandra Scot, 03/23/2012, 4:36 PM

## 2012-03-25 ENCOUNTER — Other Ambulatory Visit (HOSPITAL_COMMUNITY): Payer: 59 | Attending: Psychiatry

## 2012-03-25 DIAGNOSIS — F1920 Other psychoactive substance dependence, uncomplicated: Secondary | ICD-10-CM | POA: Insufficient documentation

## 2012-03-25 DIAGNOSIS — F1020 Alcohol dependence, uncomplicated: Secondary | ICD-10-CM | POA: Insufficient documentation

## 2012-03-26 LAB — PRESCRIPTION ABUSE MONITORING 17P, URINE
Amphetamine/Meth: NEGATIVE ng/mL
Cannabinoid Scrn, Ur: NEGATIVE ng/mL
Carisoprodol, Urine: NEGATIVE ng/mL
Cocaine Metabolites: NEGATIVE ng/mL
Meperidine, Ur: NEGATIVE ng/mL
Opiate Screen, Urine: NEGATIVE ng/mL
Tapentadol, urine: NEGATIVE ng/mL
Tramadol Scrn, Ur: NEGATIVE ng/mL

## 2012-03-27 ENCOUNTER — Other Ambulatory Visit (HOSPITAL_COMMUNITY): Payer: 59

## 2012-03-27 LAB — BENZODIAZEPINES (GC/LC/MS), URINE
Alprazolam (GC/LC/MS), ur confirm: NEGATIVE ng/mL
Alprazolam metabolite (GC/LC/MS), ur confirm: NEGATIVE ng/mL
Clonazepam metabolite (GC/LC/MS), ur confirm: NEGATIVE ng/mL
Estazolam (GC/LC/MS), ur confirm: NEGATIVE ng/mL
Lorazepam (GC/LC/MS), ur confirm: NEGATIVE ng/mL
Midazolam (GC/LC/MS), ur confirm: NEGATIVE ng/mL
Oxazepam (GC/LC/MS), ur confirm: NEGATIVE ng/mL
Temazepam (GC/LC/MS), ur confirm: NEGATIVE ng/mL

## 2012-03-30 ENCOUNTER — Other Ambulatory Visit (HOSPITAL_COMMUNITY): Payer: 59 | Admitting: Psychology

## 2012-03-30 DIAGNOSIS — F1120 Opioid dependence, uncomplicated: Secondary | ICD-10-CM

## 2012-03-30 DIAGNOSIS — F1020 Alcohol dependence, uncomplicated: Secondary | ICD-10-CM

## 2012-03-30 NOTE — Progress Notes (Unsigned)
    Daily Group Progress Note  Program: CD-IOP   Group Time: 1-2:30 pm  Participation Level: Active  Behavioral Response: Appropriate and Sharing  Type of Therapy: Process Group  Topic: Group Process: first half of group spent in process. Members shared about current issues and concerns. One member had phoned prior to group and admitted he had used and wouldn't be in group. This disclosure was shared. Another member appeared after having missed 2 sessions and also having left home with whereabouts unknown for 3 days. The medical director met with the new group members individually during the course of this session  A new group member was present. He was asked to introduce himself, but he shared very little except that he had been "caught" smoking pot. With my assistance, he explained that he had been arrested for "obstruction of justice" after having smoked pot laced with ecstasy and he became psychotic. There was good discussion and feedback among the members.  Group Time: 2:45- 4pm  Participation Level: Active  Behavioral Response: Sharing  Type of Therapy: Psycho-education Group  Topic: Psycho-Ed and Graduation: Handout on "Identifying things to avoid and things to seek out in early recovery". Members had been given a 2-page handout asking them to identify what they needed to avoid in order to stay alcohol and drug-free and what they needed to do, or begin doing, if they were to remain sober. Members shared their answers and displayed good insight about what they must do going forward if they are to avoid relapse. At the conclusion of the session, the members celebrated a graduation with the "graduation ceremony". There were kind words of thanks and hope relayed to the graduating member and he wished the members well in their recovery and encouraged them to immerse themselves in the 12-Step community.   Summary: The patient reported she had had a good morning with no one in the house,  but her and how she had enjoyed the peace and quiet. She shared that she wants to return to work next week, but I and other group members encouraged her to pace herself and take some time to get back into the flow of life. I suggested that perhaps she could find a different and slower pace that she can much more reasonably maintain without it threatening her sobriety or mental health. The patient expressed frustration with her husband and what seems like intentionally picking on their 39 yo daughter. The group provided good feedback to this group member who is only attending her second session. She met with the medical director and when she returned, she reported he didn't want her to return to work for [employer]. The group questioned her feelings on this and someone asked what her brain told her versus her gut? The patient admitted she wanted to return to her full-time job, but a part of her also knows she needs to take some time. The patient displayed good insight about her own addiction and made some excellent comments. Her sobriety date remains October 26th.   Family Program: Family present? No   Name of family member(s):   UDS collected: Yes Results: negative  AA/NA attended?: No  Sponsor?: No   [facility]

## 2012-03-31 ENCOUNTER — Encounter (HOSPITAL_COMMUNITY): Payer: Self-pay | Admitting: Psychology

## 2012-03-31 DIAGNOSIS — F1020 Alcohol dependence, uncomplicated: Secondary | ICD-10-CM | POA: Insufficient documentation

## 2012-03-31 DIAGNOSIS — F1120 Opioid dependence, uncomplicated: Secondary | ICD-10-CM | POA: Insufficient documentation

## 2012-03-31 NOTE — Progress Notes (Unsigned)
Patient ID: Jeanette Bell, female   DOB: [birth year], 41 y.o.   MRN: [id number] CD-IOP Treatment Planning Session: the patient met with me this morning for her first individual therapy session. As she walked in my office she held up the 'starter chip' she had picked up at the 10 am meeting at the [facility]. I congratulated her on attending her first group and listened as she described the warm welcome she had received from the others at the [employer]. After sharing her experience in the meeting, the patient reported she had spent a quiet few hours this morning at home, but was still uncomfortable and feeling guilty about not working. I pointed out, as we had in group yesterday, how most of her life has been spent doing and not being and it is a major adjustment when one slows down for, possibly, the first time in her life. She agreed that she needed to be more involved - a participant and not a spectator - and attentive to her own needs since her kids are getting older and will soon be out of the house. I agreed with this recognition and reminded her that this could be a very exciting time because she can create how she wants to be in her life. I explained the purpose of this first therapy session and the importance of identifying goals for treatment. The patient was very agreeable to the #1 goal of sobriety and the #2 goal of building support through the 12-step community. When asked about other goals for treatment, Jeanette Bell reported that another goal is to return to work. She stated she would like to go back next Monday.  I agreed that we could discuss this with the medical director since she has to provide a letter verifying that she is ready to return to work. I wondered about the 8-12 on M,W,F and 9-1 the other two days of the week? She was hesitant to accept this reduction in hours, but I questioned when she would attend any meetings or our weekly individual therapy sessions? I asked that the patient  consider the work hours over the course of today and tonight and we could discuss this tomorrow either before or during the break. The patient agreed to this recommendation. The treatment plan was finalized, sisnatures secured and the plan completed accordingly. We will continue to follow this patient closely in the days and weeks ahead.

## 2012-03-31 NOTE — Progress Notes (Signed)
    Daily Group Progress Note  Program: CD-IOP   Group Time: 1-2:30 pm  Participation Level: Active  Behavioral Response: Appropriate and Sharing  Type of Therapy: Process Group  Topic: Group Process: first half of group was spent in process. Members shared about the past weekend and their experiences in early recovery. Two members reported they had met and attended 2 women's only AA meetings and had a wonderful experience. One member seemed genuinely different and later read an entry from her new book, 'Daily Reflections'. Another member disclosed that he had actually smoked cannabis on Saturday 2ith friends. He denied much effort to refuse the drug and seemed disinterested in any sort of abstinence. There were examples of surrender and others of denial and resistance among group members. Two members who had spoken with me earlier in the morning and assured me they would be in group today did not appear nor did they phone to explain their no show.   Group Time: 2:45- 4pm  Participation Level: Active  Behavioral Response: Sharing  Type of Therapy: Psycho-education Group  Topic: Chaplain: Grief and Loss. The second half of group was spent with Lenell Antu, the lead chaplain at [facility]. She discussed grief and loss and how to move through these processes. She provided handouts and explained how life before loss and after loss are changed, but can reflect and mirror each other if addressed in a healthy loving manner. Terry asked each group member to think about one of their major losses and then led an activity with one member who painted a picture of her loss. Through copying the picture and then copying the copy of the picture, she was able to demonstrate how grief recedes as time passes and perspectives change. Aurther Loft also emphasized the importance of self-care while grieving. It was pointed out that many of these suggestions reflect the same suggestions provided  those in early recovery. The session proved very helpful and insightful to the entire group.   Summary: The patient reported she had had a busy weekend, but had stayed alcohol and drug-free. She admitted she had not been to a meeting yet. The patient expressed frustration about her family and the expectations that are expected of her. In the second half of group, the patient shared about her losses, including the death of her grandmother when she was 13 yo. Even more important, this patient shared that she had married early and had children very quickly and she feels as if she lost a part of her self. The loss of self was a big one for many people, the chaplain assured her, but one can mourn this loss, but come to see it differently through stories. The patient seemed genuinely touched by the experience and responded well to this intervention.     Family Program: Family present? No   Name of family member(s):   UDS collected: No Results:   AA/NA attended?: No, not yet  Sponsor?: No   Dickson Kostelnik, LCAS

## 2012-04-01 ENCOUNTER — Other Ambulatory Visit (HOSPITAL_COMMUNITY): Payer: 59 | Admitting: Psychology

## 2012-04-01 DIAGNOSIS — F1120 Opioid dependence, uncomplicated: Secondary | ICD-10-CM

## 2012-04-01 DIAGNOSIS — F1020 Alcohol dependence, uncomplicated: Secondary | ICD-10-CM

## 2012-04-01 DIAGNOSIS — F1920 Other psychoactive substance dependence, uncomplicated: Secondary | ICD-10-CM

## 2012-04-01 DIAGNOSIS — F1320 Sedative, hypnotic or anxiolytic dependence, uncomplicated: Secondary | ICD-10-CM

## 2012-04-02 ENCOUNTER — Encounter (HOSPITAL_COMMUNITY): Payer: Self-pay | Admitting: Psychology

## 2012-04-02 LAB — PRESCRIPTION ABUSE MONITORING 17P, URINE
Amphetamine/Meth: NEGATIVE ng/mL
Barbiturate Screen, Urine: NEGATIVE ng/mL
Benzodiazepine Screen, Urine: NEGATIVE ng/mL
Cannabinoid Scrn, Ur: NEGATIVE ng/mL
Carisoprodol, Urine: NEGATIVE ng/mL
Cocaine Metabolites: NEGATIVE ng/mL
Fentanyl, Ur: NEGATIVE ng/mL
MDMA URINE: NEGATIVE ng/mL
Meperidine, Ur: NEGATIVE ng/mL
Tapentadol, urine: NEGATIVE ng/mL
Tramadol Scrn, Ur: NEGATIVE ng/mL
Zolpidem, Urine: NEGATIVE ng/mL

## 2012-04-02 NOTE — Progress Notes (Signed)
Daily Group Progress Note  Program: CD-IOP   Group Time: 1-2:30 pm  Participation Level: Active  Behavioral Response: Appropriate and Sharing  Type of Therapy: Process Group  Topic: Group Process: first half of group was spent in process. Members shared about current issues and concerns. One member was present today after being absent for the last 2 sessions. She insisted she had remained sober, but noted that it had been very difficult and she is keeping herself very busy. There was good discussion and feedback about various problems, including primarily family and close friends who don't seem to understand their loved one's needs. The session proved helpful and invited members to talk about their feelings and receive feedback on their thoughts.   Group Time: 2:45- 4pm  Participation Level: Active  Behavioral Response: Sharing  Type of Therapy: Psycho-education Group  Topic: Guest Speaker/Graduation: a former group member who had graduated successfully some time ago came by to say hello and share his experiences in recovery. He shared his story, including using for 35 years. He admitted he had been homeless for 5 of those years, including living under bridges, sleeping in abandoned cars, and even staying in a dog house. He emphasized the understanding he had attained through this program and the importance of the 12-step meetings. The group responded very enthusiastically to the speaker and noted that he inspired them to want to remain sober and work a Product manager. The session concluded with a graduation ceremony for a member who was completing the program successfully today. There were kind words and brownies shared as the group bid this member farewell. He thanked his fellow group members, wished them well and encouraged them to commit to 12-step meetings for the future.   Summary: The patient reported she is struggling with not working and doesn't know what to do with herself.  She showed the group the chip she had picked up yesterday at the 10 am AA meeting. She noted she had even gotten up the courage to speak and share during the meeting. The patient expressed frustration over her children and the family expectations. She agreed that she had assumed the role of the "fix-it" for many years and they had come to expect this from her. The patient agreed with another member that her identify comes from her work. She requested a letter letting her go back to work, but admitted she probably wasn't ready to return. She admitted she felt guilty because others at her work were short-handed or had other problems and she felt like she was only contributing to the pressures due to her absence. Other members encouraged her to let go of worries about others and focus on what her needs are at this time. She agreed that this was the thing to do, but admitted it was very difficult. The patient stood for part of the group because her lower back was aching. She noted she has a high pain tolerance and just lives with it. She complimented the graduating member and noted he had been very inspiring and she wished him well. The patient is doing well and seems to be accepting her role in the dysfunctional expectation of her towards everyone else. She made some good comments and her sobriety date remains 10/26.    Family Program: Family present? No   Name of family member(s):   UDS collected: Yes Results: not returned  AA/NA attended?: Dalphine Handing was her first AA meeting here in [facility]?: No  Sukhman Kocher, LCAS

## 2012-04-03 ENCOUNTER — Other Ambulatory Visit (HOSPITAL_COMMUNITY): Payer: 59 | Admitting: Psychology

## 2012-04-06 ENCOUNTER — Other Ambulatory Visit (HOSPITAL_COMMUNITY): Payer: 59 | Admitting: Psychology

## 2012-04-06 ENCOUNTER — Encounter (HOSPITAL_COMMUNITY): Payer: Self-pay | Admitting: Psychology

## 2012-04-06 DIAGNOSIS — F1310 Sedative, hypnotic or anxiolytic abuse, uncomplicated: Secondary | ICD-10-CM

## 2012-04-06 NOTE — Progress Notes (Unsigned)
    Daily Group Progress Note  Program: CD-IOP   Group Time: 1-2:30 pm  Participation Level: Active  Behavioral Response: Appropriate and Sharing  Type of Therapy: Process Group  Topic: Group Process: first half of group was spent in process. There was a guest in group today, another nursing student from [employer]. she was welcomed and asked to participate in the discussion. Members talked about the difficulties and stressors in early recovery. One member made a lot of excuses while another member admitted her husband seems very impatient and frustrated with her not returning to work. The group wondered whether their family members would be different if they had a diagnosis of cancer versus chemical dependency? The nursing student shared about her life and disclosed that her father had been an alcoholic and addict and it had proven very confusing and painful for her growing up. She noted he had died young because of complications due to his addiction. She encouraged the group members to share with their families and loved ones and allow them to talk about their fears and angers living with an addict. One group member seemed particularly challenged by the discussion about family and bringing them to group after she stated that she didn't think her 39 yo daughter would have any reason or benefit to be here with her in the program. The student nurse challenged the group stating that by not speaking about the addiction or inviting the family to share, they do them an injustice. The session was compelling with a lot of emotion behind it.  Group Time: 2:45- 4pm  Participation Level: Active  Behavioral Response: Sharing  Type of Therapy: Psycho-education Group  Topic: "The Wheel of Life": the second half of group was spent in a psycho-education piece that included members presenting their own wheel of life on the board and explaining this to other members. There was good discussion during the exercise  and members were challenged to disclose about the details of their lives. The group reviewed their plans for the weekend and committed to remaining alcohol and drug-free.  Summary: The patient admitted her husband questioned her about returning to work and appeared very frustrated that she would not be going back next week. She admitted she is probably not ready, but also noted she doesn't know what to do with her free time. She experiences tremendous guilty because she isn't working. The patient was open about her compulsion with work and keeping busy and displays considerable courage in disclosing her issues. She drew her Wheel of Life and made some excellent comments.    Family Program: Family present? No   Name of family member(s):   UDS collected: No Results:  AA/NA attended?: YesTuesday and Thursday  Sponsor?: No   Harlo Jaso, LCAS

## 2012-04-07 NOTE — Discharge Summary (Signed)
Physician Discharge Summary Note  Patient:  Jeanette Bell is an 41 y.o., female MRN:  [id number] DOB:  11-05-1970 Patient phone:  [phone] (home)  Patient address:   [place] [address]   Date of Admission:  03/18/2012 Date of Discharge: 03/21/2012  Discharge Diagnoses: Principal Problem:  *Bipolar 1 disorder, depressed  Axis Diagnosis: AXIS I: Bipolar, mixed  AXIS II: Deferred  AXIS III:  Past Medical History   Diagnosis  Date   .  Chronic hip pain      left hip. treated with radiofrequency   .  Depression    .  Bipolar 1 disorder, depressed  03/18/2012    AXIS IV: other psychosocial or environmental problems  AXIS V: 51-60 moderate symptoms    Level of Care:  Inpatient Hospitalization.  Reason for admission: Patient admitted with suicidal thoughts. Hospital Course:   The patient attended treatment team meeting this am and met with treatment team members. The patient's symptoms, treatment plan and response to treatment was discussed. The patient endorsed that their symptoms have improved. The patient also stated that they felt stable for discharge.  They reported that from this hospital stay they had learned many coping skills.  In other to maintain their psychiatric stability, they will continue psychiatric care on an outpatient basis. They will follow-up as outlined below.  In addition they were instructed  to take all your medications as prescribed by their mental healthcare provider and to report any adverse effects and or reactions from your medicines to their outpatient provider promptly.  The patient is also instructed and cautioned to not engage in alcohol and or illegal drug use while on prescription medicines.  In the event of worsening symptoms the patient is instructed to call the crisis hotline, 911 and or go to the nearest ED for appropriate evaluation and treatment of symptoms.   Also while a patient in this hospital, the patient received medication management  for her psychiatric symptoms. They were ordered and received as outlined below:    Medication List     As of 04/07/2012  1:50 PM    STOP taking these medications         clonazePAM 1 MG tablet   Commonly known as: KLONOPIN      oxyCODONE-acetaminophen 7.5-325 MG per tablet   Commonly known as: PERCOCET      tiZANidine 2 MG tablet   Commonly known as: ZANAFLEX      TAKE these medications      Indication    acetaminophen 500 MG tablet   Commonly known as: TYLENOL   Take 1,000 mg by mouth every 6 (six) hours as needed. For pain       hydrOXYzine 25 MG tablet   Commonly known as: ATARAX/VISTARIL   Take 1 tablet (25 mg total) by mouth every 6 (six) hours as needed for anxiety.       hydrOXYzine 50 MG tablet   Commonly known as: ATARAX/VISTARIL   Take 1 tablet (50 mg total) by mouth at bedtime and may repeat dose one time if needed. For sleep       lamoTRIgine 200 MG tablet   Commonly known as: LAMICTAL   Take 2 tablets (400 mg total) by mouth daily. For mood stabilization       venlafaxine 75 MG tablet   Commonly known as: EFFEXOR   Take 1 tablet (75 mg total) by mouth daily. For depression and anxiety        They were  also enrolled in group counseling sessions and activities in which they participated actively.       Follow-up Information    Follow up with [facility]. On 03/24/2012. (Patient has an appointment with Minda Ditto on Tuesday, November 5,2013 at 11:30 am. )    Contact information:   [address]  [place], [place] [address]  [phone]         Upon discharge, patient adamantly denies suicidal, homicidal ideations, auditory, visual hallucinations and or delusional thinking. They left [facility] with all personal belongings via personal transportation in no apparent distress.  Consults:  Please see electronic medical record for details.  Significant Diagnostic Studies:  Please see electronic medical record for details.  Discharge Vitals:   Blood pressure  107/70, pulse 78, temperature 98.4 F (36.9 C), temperature source Oral, resp. rate 16, height 5\' 6"  (1.676 m), weight 70.308 kg (155 lb)..  Mental Status Exam: See Mental Status Examination and Suicide Risk Assessment completed by Attending Physician prior to discharge.  Discharge destination:  Home  Is patient on multiple antipsychotic therapies at discharge:  No  Has Patient had three or more failed trials of antipsychotic monotherapy by history: N/A Recommended Plan for Multiple Antipsychotic Therapies: N/A    Medication List     As of 04/07/2012  1:50 PM    STOP taking these medications         clonazePAM 1 MG tablet   Commonly known as: KLONOPIN      oxyCODONE-acetaminophen 7.5-325 MG per tablet   Commonly known as: PERCOCET      tiZANidine 2 MG tablet   Commonly known as: ZANAFLEX      TAKE these medications      Indication    acetaminophen 500 MG tablet   Commonly known as: TYLENOL   Take 1,000 mg by mouth every 6 (six) hours as needed. For pain       hydrOXYzine 25 MG tablet   Commonly known as: ATARAX/VISTARIL   Take 1 tablet (25 mg total) by mouth every 6 (six) hours as needed for anxiety.       hydrOXYzine 50 MG tablet   Commonly known as: ATARAX/VISTARIL   Take 1 tablet (50 mg total) by mouth at bedtime and may repeat dose one time if needed. For sleep       lamoTRIgine 200 MG tablet   Commonly known as: LAMICTAL   Take 2 tablets (400 mg total) by mouth daily. For mood stabilization       venlafaxine 75 MG tablet   Commonly known as: EFFEXOR   Take 1 tablet (75 mg total) by mouth daily. For depression and anxiety            Follow-up Information    Follow up with [facility]. On 03/24/2012. (Patient has an appointment with Minda Ditto on Tuesday, November 5,2013 at 11:30 am. )    Contact information:   [address]  [place], [place] [address]  [phone]        Follow-up recommendations:   Activities: Resume typical activities Diet: Resume  typical diet Other: Follow up with outpatient provider and report any side effects to out patient prescriber.  Comments:  Take all your medications as prescribed by your mental healthcare provider. Report any adverse effects and or reactions from your medicines to your outpatient provider promptly. Patient is instructed and cautioned to not engage in alcohol and or illegal drug use while on prescription medicines. In the event of worsening symptoms, patient is instructed  to call the crisis hotline, 911 and or go to the nearest ED for appropriate evaluation and treatment of symptoms. Follow-up with your primary care provider for your other medical issues, concerns and or health care needs.  Signed: Ameshia Pewitt 04/07/2012 1:50 PM

## 2012-04-08 ENCOUNTER — Encounter (HOSPITAL_COMMUNITY): Payer: Self-pay | Admitting: Psychology

## 2012-04-08 ENCOUNTER — Other Ambulatory Visit (HOSPITAL_COMMUNITY): Payer: 59 | Admitting: Psychology

## 2012-04-08 DIAGNOSIS — F1310 Sedative, hypnotic or anxiolytic abuse, uncomplicated: Secondary | ICD-10-CM | POA: Insufficient documentation

## 2012-04-08 DIAGNOSIS — F1120 Opioid dependence, uncomplicated: Secondary | ICD-10-CM

## 2012-04-08 DIAGNOSIS — F1020 Alcohol dependence, uncomplicated: Secondary | ICD-10-CM

## 2012-04-08 NOTE — Progress Notes (Signed)
    Daily Group Progress Note  Program: CD-IOP   Group Time: 1-2:30 pm  Participation Level: Active  Behavioral Response: Appropriate and Sharing  Type of Therapy: Process Group  Topic: Group Process/Guest Speaker: Group began with a brief check-in. Members shared about the past weekend and what actions they did to support their recovery.  While some had attended 12-step meetings, others did not. Those that did not attend any meetings had plenty of excuses. A guest speaker arrived in the form of a previous group member who had graduated from the program some time ago. He joined the session and described what he had done once he recognized his drinking was out of control. He was extremely open and honest about his addiction and pointed out the mistakes he had made in early recovery. The patient displayed a very good understanding of the concepts behind the 12-steps and encouraged group members to attend meetings, secure a sponsor and read the Big Book. His visit proved very inspiring and encouraging to the group.   Group Time: 2:45- 4pm  Participation Level: Active  Behavioral Response: Appropriate and Sharing  Type of Therapy: Psycho-education Group  Topic: Codependence - Part I: A presentation was provided on the issue of codependence. Handouts were provided and the group read examples of codependent behaviors and shared their own history and codependent behaviors. There were many 'confessions' of insecurity, mind-reading, and destructive sexual patterns disclosed. Almost everyone agreed that they had often confused love and sex. There was a good discussion among group members and the session proved very informative.    Summary: The patient reported she continues to struggle with guilt about not working and having all this free time. She reported she had attended the 8 am Summit meeting this morning and saw Link Snuffer and Britt Bottom, the guest speaker from last week. The patient reported she has  to learn to let go of control. She she listened to the presentation on "Codependency", she noted that both she and her husband are codependency. The patient is making good effort and has been attending meetings as instructed. She is making good progress and gaining insight into her dysfunctional behavior patterns at home and work. She made some good comments.   Family Program: Family present? No   Name of family member(s):   UDS collected: No Results:  AA/NA attended?: YesMonday, Saturday and Sunday  Sponsor?: No, but she is seeking one   Jocabed Cheese, LCAS

## 2012-04-09 ENCOUNTER — Encounter (HOSPITAL_COMMUNITY): Payer: Self-pay | Admitting: Psychology
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
## 2012-04-10 ENCOUNTER — Other Ambulatory Visit (HOSPITAL_COMMUNITY): Payer: Self-pay | Admitting: Physician Assistant

## 2012-04-10 ENCOUNTER — Other Ambulatory Visit (HOSPITAL_COMMUNITY): Payer: 59

## 2012-04-10 ENCOUNTER — Telehealth (HOSPITAL_COMMUNITY): Payer: Self-pay | Admitting: Psychology

## 2012-04-13 ENCOUNTER — Other Ambulatory Visit (HOSPITAL_COMMUNITY): Payer: Self-pay | Admitting: *Deleted

## 2012-04-13 ENCOUNTER — Other Ambulatory Visit (HOSPITAL_COMMUNITY): Payer: 59 | Admitting: Psychology

## 2012-04-13 DIAGNOSIS — F1420 Cocaine dependence, uncomplicated: Secondary | ICD-10-CM

## 2012-04-13 DIAGNOSIS — F1320 Sedative, hypnotic or anxiolytic dependence, uncomplicated: Secondary | ICD-10-CM

## 2012-04-13 DIAGNOSIS — F1020 Alcohol dependence, uncomplicated: Secondary | ICD-10-CM

## 2012-04-13 NOTE — Telephone Encounter (Signed)
Authorized by [employer]	[place] in [place], [place] absence

## 2012-04-14 ENCOUNTER — Other Ambulatory Visit (HOSPITAL_COMMUNITY): Payer: 59 | Admitting: Psychology

## 2012-04-14 DIAGNOSIS — F1120 Opioid dependence, uncomplicated: Secondary | ICD-10-CM

## 2012-04-14 DIAGNOSIS — F1320 Sedative, hypnotic or anxiolytic dependence, uncomplicated: Secondary | ICD-10-CM

## 2012-04-14 DIAGNOSIS — F1020 Alcohol dependence, uncomplicated: Secondary | ICD-10-CM

## 2012-04-15 ENCOUNTER — Other Ambulatory Visit (HOSPITAL_COMMUNITY): Payer: 59

## 2012-04-17 ENCOUNTER — Other Ambulatory Visit (HOSPITAL_COMMUNITY): Payer: 59

## 2012-04-20 ENCOUNTER — Other Ambulatory Visit (HOSPITAL_COMMUNITY): Payer: 59 | Attending: Psychiatry | Admitting: Psychology

## 2012-04-20 DIAGNOSIS — F1320 Sedative, hypnotic or anxiolytic dependence, uncomplicated: Secondary | ICD-10-CM

## 2012-04-20 DIAGNOSIS — F1120 Opioid dependence, uncomplicated: Secondary | ICD-10-CM

## 2012-04-20 DIAGNOSIS — F1020 Alcohol dependence, uncomplicated: Secondary | ICD-10-CM

## 2012-04-20 DIAGNOSIS — F319 Bipolar disorder, unspecified: Secondary | ICD-10-CM | POA: Insufficient documentation

## 2012-04-20 DIAGNOSIS — F1920 Other psychoactive substance dependence, uncomplicated: Secondary | ICD-10-CM | POA: Insufficient documentation

## 2012-04-21 ENCOUNTER — Encounter (HOSPITAL_COMMUNITY): Payer: Self-pay | Admitting: Psychology

## 2012-04-21 LAB — PRESCRIPTION ABUSE MONITORING 17P, URINE
Amphetamine/Meth: NEGATIVE ng/mL
Cannabinoid Scrn, Ur: NEGATIVE ng/mL
Carisoprodol, Urine: NEGATIVE ng/mL
Cocaine Metabolites: NEGATIVE ng/mL
MDMA URINE: NEGATIVE ng/mL
Opiate Screen, Urine: NEGATIVE ng/mL
Oxycodone Screen, Ur: NEGATIVE ng/mL
Tapentadol, urine: NEGATIVE ng/mL
Tramadol Scrn, Ur: NEGATIVE ng/mL
Zolpidem, Urine: NEGATIVE ng/mL
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
## 2012-04-22 ENCOUNTER — Telehealth (HOSPITAL_COMMUNITY): Payer: Self-pay | Admitting: Psychology

## 2012-04-22 ENCOUNTER — Other Ambulatory Visit (HOSPITAL_COMMUNITY): Payer: 59 | Admitting: Psychology

## 2012-04-22 DIAGNOSIS — F1020 Alcohol dependence, uncomplicated: Secondary | ICD-10-CM

## 2012-04-22 DIAGNOSIS — F1120 Opioid dependence, uncomplicated: Secondary | ICD-10-CM

## 2012-04-22 NOTE — Progress Notes (Unsigned)
Patient ID: Jeanette Bell, female   DOB: 1970/07/22, 41 y.o.   MRN: [id number] CD-IOP - Individual Therapy Session: Met with this patient this morning as scheduled. She had come from work and was wearing scrubs. 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 She reported she is pleased with being back at work and her co-workers have been very friendly and supportive. If someone asked where she has been, she reported she will tell them the truth. She noted her supervisor had admitted to her that she had been very distant and "slow" before entering treatment and the patient reported that she surely would have been fired if she had continued working and using the pills as she had been. She reported her husband is now refusing to come with her to meet with me. He has no interest and wants her to get back to work. The patient expressed frustration, but reported she is going to do what she needs to do. We discussed her treatment goals and I commended her on her motivation and commitment to recovery. Among all of the group members, this member has done more of what we have asked than anyone else. She is attending AA meetings almost every day and has secured a sponsor. She agreed that she always gets something positive out of the meetings. She had also picked up her 30 day chip last week. She has made excellent progress towards her treatment goals, which include sobriety, building a support network, and returning to work. I asked how she was feeling and commented on her many migraines that she has talked about in group sessions? She admitted that she almost always has a headache and the migraines are often. I suggested she consider taking something and she wondered about Topomax? She noted that she had taken it before and it had proven very helpful. I stated that I would speak with the medical director and ask if he would be willing to prescribe this medication for her? We agreed to talk later in the day and that I would  confirm his intentions about the medication. This patient is doing very well in her recovery and is a candid and open group member. We will meet again next Tuesday, the 10th, at 11 am and update her treatment plan.

## 2012-04-23 ENCOUNTER — Other Ambulatory Visit (HOSPITAL_COMMUNITY): Payer: Self-pay | Admitting: Physician Assistant

## 2012-04-23 ENCOUNTER — Encounter (HOSPITAL_COMMUNITY): Payer: Self-pay | Admitting: Psychology

## 2012-04-23 MED ORDER — TOPIRAMATE 25 MG PO TABS: 25.0000 mg | ORAL_TABLET | Freq: Two times a day (BID) | ORAL | Status: DC

## 2012-04-23 NOTE — Progress Notes (Signed)
    Daily Group Progress Note  Program: CD-IOP   Group Time: 1-2:30 pm  Participation Level: Active  Behavioral Response: Appropriate and Sharing  Type of Therapy: Process Group  Topic: Group Process: first part of group spent in process. Members shared about current issues and concerns. There were 2 group members who had not shared about their own lives and struggles and during this session they introduced themselves. The results of the drug tests collected on Monday were shared. One member admitted she had drunk alcohol over the weekend, but had failed to disclose this on Monday. There was a long discussion among members who expressed their concerns about this group member and her continued drinking despite serious liver disease. The group provided very validating feedback and support to this woman. This proved to be a very intense session with the focus going beyond the drinking to one's deeper sense of worth and purpose in the world.  Group Time: 2:45- 4pm  Participation Level: Active  Behavioral Response: Sharing  Type of Therapy: Psycho-education Group  Topic: Denial and other Cognitive Distortions: second half of group was spent in the ongoing discussion from the earlier session, but there was a handout and discussion on different forms of denial and cognitive distortions. Members were asked to identify examples of how they have rationalized, minimized, and distracted while they were in their active addictions. As the session neared the end, members were asked what they would be doing to support their recovery between now and the next session. While some reported they would be attending 12-step meetings, others used some of the previously mentioned distortions while others admitted they would not be doing much of anything.   Summary: The patient reported she would begin attending evening meetings since she was back to working in the daytime. She disclosed that her husband had been  off work today and she had asked him to accompany her to the session. He refused to go and reminded her of how he felt about "those sorts of things". He has diminished and negated her struggles with addiction and the mood disorder and discounts what she is doing to get well. She admitted she just shrugged her shoulders and has given up on him trying to understand or even support her. She shared her own frustrations and struggles with her recovery and admitted that she is trying to let go of her efforts to 'control' everybody and everything. Many in the group joined her in this struggle and shared some of their own experiences. The patient provided good feedback to her fellow group members and continues to make good progress in her recovery. Her sobriety date remains 10/26.    Family Program: Family present? No   Name of family member(s):   UDS collected: No Results:  AA/NA attended?: YesMonday and Tuesday  Sponsor?: Yes   Kindall Swaby, LCAS

## 2012-04-24 ENCOUNTER — Encounter (HOSPITAL_COMMUNITY): Payer: Self-pay

## 2012-04-24 ENCOUNTER — Encounter (HOSPITAL_COMMUNITY): Payer: Self-pay | Admitting: Psychology

## 2012-04-24 ENCOUNTER — Other Ambulatory Visit (HOSPITAL_COMMUNITY): Payer: 59 | Admitting: Psychology

## 2012-04-24 DIAGNOSIS — F1020 Alcohol dependence, uncomplicated: Secondary | ICD-10-CM

## 2012-04-24 DIAGNOSIS — F1320 Sedative, hypnotic or anxiolytic dependence, uncomplicated: Secondary | ICD-10-CM

## 2012-04-24 DIAGNOSIS — F1120 Opioid dependence, uncomplicated: Secondary | ICD-10-CM

## 2012-04-24 NOTE — Progress Notes (Signed)
    Daily Group Progress Note  Program: CD-IOP   Group Time: 1-2:30 pm  Participation Level: Active  Behavioral Response: Appropriate and Sharing  Type of Therapy: Process Group  Topic: Group Process: first half of group was spent in process. The session was facilitated by a guest counselor Boneta Lucks) and these patient notes are taken from her written correspondence.   Group Time: 2:45- 4pm  Participation Level: minimal  Behavioral Response: Sharing  Type of Therapy: Psycho-education Group  Topic: Forgiveness: a handout was provided on forgiveness. Members shared about the feelings they have kept in and the subsequent resentments that have collected.  In addition to forgiving others, there was a lengthy discussion on forgiving one's self and its importance in early recovery.   Summary: This group member joined the discussion by sharing about her control issues, especially with oldest daughter. She also disclosed how she is managing her social anxiety. The patient shared little in the session addressing the forgiveness handout.    Family Program: Family present? No   Name of family member(s):   UDS collected: No Results:   AA/NA attended?: YesSaturday and Sunday  Sponsor?: Yes   Frank Pilger, LCAS

## 2012-04-24 NOTE — Progress Notes (Signed)
    Daily Group Progress Note  Program: CD-IOP   Group Time: 1-2:30 pm  Participation Level: Active  Behavioral Response: Sharing  Type of Therapy: Process Group  Topic: Group Process: the first half of group was spent in process. The group was very small today as the date of the group session had been changed from Wednesday to Tuesday and a number of members had reported previous commitments. This session was facilitated by a guest counselor Boneta Lucks) and these patient notes are taken from her written correspondence.   Group Time: 2:45-4pm  Participation Level: minimal engagement  Behavioral Response: Rigid  Type of Therapy: Psycho-education Group  Topic: Second part of session was spent in psycho-education. Members observed a film on compulsive/apathetic/healthy relationships and discussed the film and the various examples of relationships displayed in the film.   Summary: The patient continued to discuss her social anxiety and the tendency to avoid family and social situations during the holidays. She reported they have very minimal commitments during the upcoming thanksgiving holiday. In the second half of group, the patient reported that her relationship with her husband has some compulsive, apathetic, and healthy attributes. She admitted that she wishes her husband would join with her in personal and family activities and interests. He has refused to come to treatment with her here at [facility] to discuss her recovery.  Family Program: Family present? No   Name of family member(s):   UDS collected: No Results:  AA/NA attended?: YesTuesday  Sponsor?: Yes   [facility]

## 2012-04-26 ENCOUNTER — Encounter (HOSPITAL_COMMUNITY): Payer: Self-pay | Admitting: Psychology

## 2012-04-26 NOTE — Progress Notes (Signed)
    Daily Group Progress Note  Program: CD-IOP   Group Time: 1-2:30 pm  Participation Level: Active  Behavioral Response: Appropriate and Sharing  Type of Therapy: Process Group  Topic: Communication: second half of group was spent discussing "Assertive Communication". Members were encouraged to address feelings as they arise and not hold them in or dismiss them. Emphasis included the problems that arise as one continues to discount or negate their feelings and how this leads to spontaneous "blow-ups" that are inappropriate and painful for all involved. Included was a discussion on how, in the previous session, one member could have shared her feelings with another member instead of letting them fester and responding aggressively later on in the same session. There was good feedback, but members still display hesitancy to be assertive as they associate this with arguments and ill feelings.   Group Time: 2:45- 4pm  Participation Level: Active  Behavioral Response: Sharing  Type of Therapy: Psychoeducation  Topic:Communication: second half of group was spent discussing "Assertive Communication". Members were encouraged to address feelings as they arise and not hold them in or dismiss them. Emphasis included the problems that arise as one continues to discount or negate their feelings and how this leads to spontaneous "blow-ups" that are inappropriate and painful for all involved. Included was a discussion on how, in the previous session, one member could have shared her feelings with another member instead of letting them fester and responding aggressively later on in the same session. There was good feedback, but members still display hesitancy to be assertive as they associate this with arguments and ill feelings.    Summary: The patient checked-in with a sobriety date of today, 12/6. The group was a bit stunned and didn't understand what she was saying. She had to explain that she had  drunk a beer last night. The patient admitted she was filled with remorse, regret and shame. She explained that she had gotten out family pictures and albums from the past and became very emotional as she looked through the photos of her children and came to recognize, "just how much of their lives I have missed because I was out of it". The group was able to empathize with her, but emphasized that it was very understandable and that it was most important that she had stopped at just one beer and even more, she had come to group today. The patient continued to receive excellent and supportive feedback and was in need of a gentle reminder of the importance of coming back. The group offered concern about her work load and the stressors of her family and all agreed she rarely took time for herself. The patient received the support she needed at this time and was receptive to the words spoken to her. Her new sobriety date is 12/6.   Family Program: Family present? No, despite repeated requests by this patient, her husband refuses to appear for group or even couple's therapy.   Name of family member(s):   UDS collected: No Results:   AA/NA attended?: YesMonday, Tuesday and Wednesday  Sponsor?: Yes   Sissy Goetzke, LCAS

## 2012-04-27 ENCOUNTER — Other Ambulatory Visit (HOSPITAL_COMMUNITY): Payer: 59

## 2012-04-29 ENCOUNTER — Other Ambulatory Visit (HOSPITAL_COMMUNITY): Payer: 59 | Admitting: Psychology

## 2012-04-29 ENCOUNTER — Encounter (HOSPITAL_COMMUNITY): Payer: Self-pay | Admitting: Psychology

## 2012-04-29 DIAGNOSIS — F1320 Sedative, hypnotic or anxiolytic dependence, uncomplicated: Secondary | ICD-10-CM

## 2012-04-29 DIAGNOSIS — F1120 Opioid dependence, uncomplicated: Secondary | ICD-10-CM

## 2012-04-29 DIAGNOSIS — F1020 Alcohol dependence, uncomplicated: Secondary | ICD-10-CM

## 2012-05-01 ENCOUNTER — Other Ambulatory Visit (HOSPITAL_COMMUNITY): Payer: 59 | Admitting: Psychology

## 2012-05-01 ENCOUNTER — Encounter (HOSPITAL_COMMUNITY): Payer: Self-pay | Admitting: Psychology

## 2012-05-01 DIAGNOSIS — F3110 Bipolar disorder, current episode manic without psychotic features, unspecified: Secondary | ICD-10-CM

## 2012-05-01 DIAGNOSIS — F1120 Opioid dependence, uncomplicated: Secondary | ICD-10-CM

## 2012-05-01 DIAGNOSIS — F1020 Alcohol dependence, uncomplicated: Secondary | ICD-10-CM

## 2012-05-01 DIAGNOSIS — F1320 Sedative, hypnotic or anxiolytic dependence, uncomplicated: Secondary | ICD-10-CM

## 2012-05-01 MED ORDER — RISPERIDONE 1 MG PO TABS: ORAL_TABLET | ORAL | Status: DC

## 2012-05-01 NOTE — Progress Notes (Signed)
    Daily Group Progress Note  Program: CD-IOP   Group Time: 1-2:30 pm  Participation Level: Active  Behavioral Response: Appropriate  Type of Therapy: Psycho-education Group  Topic: Triggers: External and Internal; What are yours? The first part of group included a check-in and a psycho educational piece. The session was spent educating about triggers, identifying "your triggers" and how to eliminate and/or address them. Members were easily able to identify both types of triggers. Physical triggers included money, [employer], drug dealers, weekends, and friends. Internal triggers included every kind of emotion and some of the newer members were not familiar with the differences. The session proved informative and members clearly gained from this discussion  Group Time: 2:45- 4pm  Participation Level: Active  Behavioral Response: Sharing and Assertive  Type of Therapy: Process Group  Topic: Group Process/Introduction: The second part of group was spent in process. Members shared about their current issues and struggles in early recovery. Members shared about their attendance, or lack of attendance, in 12-step meetings. There was good disclosure among the group and the feedback and support was welcomed.  Summary: The patient reported she has been very busy and admitted she had not slept much these past few years. Another member pointed out she had been 'bouncing her foot' since she first got here. She agreed that she is pretty wound up. The patient was able to identify a number of triggers, including a plastic cup, which is what she had carried through the years to hold her alcohol as she drove around as well as what she used at home. She reported she had gone to the [place] and spent almost $200 buying clothes for herself and her daughter. Another member asked about her Bipolar Disorder and Deziya did admit that spending money is one of red flags when she moves into a manic phase. The group  expressed concerns about this patient and whether she was not moving into a manic phase. She assured them she would be fine and pointed out she had felt this way before and would be okay. The patient appeared unconcerned about what she is experiencing and assured them she would be fine. The medical director will be informed of this patient's reports and he will meet with her during the next session. The patient had relapsed and her new sobriety date remains 12/6.   Family Program: Family present? No   Name of family member(s):   UDS collected: No Results:  AA/NA attended?: YesMonday and Tuesday  Sponsor?: Yes   Adalie Mand, LCAS

## 2012-05-04 ENCOUNTER — Other Ambulatory Visit (HOSPITAL_COMMUNITY): Payer: 59 | Admitting: Psychology

## 2012-05-04 ENCOUNTER — Encounter (HOSPITAL_COMMUNITY): Payer: Self-pay | Admitting: Psychology

## 2012-05-04 DIAGNOSIS — F1920 Other psychoactive substance dependence, uncomplicated: Secondary | ICD-10-CM

## 2012-05-04 DIAGNOSIS — F1020 Alcohol dependence, uncomplicated: Secondary | ICD-10-CM

## 2012-05-04 DIAGNOSIS — F1320 Sedative, hypnotic or anxiolytic dependence, uncomplicated: Secondary | ICD-10-CM

## 2012-05-04 DIAGNOSIS — F1120 Opioid dependence, uncomplicated: Secondary | ICD-10-CM

## 2012-05-04 NOTE — Progress Notes (Signed)
    Daily Group Progress Note  Program: CD-IOP   Group Time: 1-2:30 pm  Participation Level: Minimal  Behavioral Response: Sharing  Type of Therapy: Psycho-education Group  Topic: Group Process: first half of group was spent in process. Members shared about their current struggles and issues. During this time the medical director met with 2 new group members for their initial sessions. He also spoke with 2 other group members about concerns they are having. There was one new group member and he shared about himself and his drinking. There was good disclosure and members provided excellent support and feedback to their fellow members.   Group Time: 2:45-4pm  Participation Level: Active  Behavioral Response: Appropriate and Sharing  Type of Therapy: Psycho-education Group  Topic: The Progressive Disease of Addiction: The second half of group was spent in a psych educational presentation on "The Progressive Disease of Addiction". A handout was provided charting the growing deterioration that occurs as one progresses in their alcohol and drug use. The group identified the developing problems that are evidenced as the disease progresses and at the conclusion of the presentation, members were asked to share where they were on the continuum prior to entering the program. Almost every member agreed that he or she had been near the end, or bottom, of the continuum. The presentation proved effective and elicited good disclosure among group members.    Summary: The patient arrived in much the same way as she had last appeared. She was very "wired', talking quickly, bouncing her leg, and reporting she had not slept much in the past 4 days. She noted that even her husband is exhausted because he has not slept because she has been wide awake. She admitted her co-workers this morning told her "I was driving them crazy".  As other members were sharing, in many instances about painful issues and feelings,  I observed Lilianna smiling as she listened attentively. In the second half of group, the patient was asked to meet with the medical director. He later confirmed that she was in a 'manic phase' and had been provided a prescription to address the mania. The patient has relapsed once since entering the program, but overall she has done very well and has a good understanding of her recovery needs. It is viewed as positive that this manic phase has occurred while she is in treatment.    Family Program: Family present? No   Name of family member(s):   UDS collected: No Results:  AA/NA attended?: YesMonday, Tuesday and Thursday  Sponsor?: Yes   Garfield Coiner, LCAS

## 2012-05-05 ENCOUNTER — Other Ambulatory Visit (HOSPITAL_COMMUNITY): Payer: Self-pay | Admitting: Physician Assistant

## 2012-05-05 ENCOUNTER — Encounter (HOSPITAL_COMMUNITY): Payer: Self-pay | Admitting: Psychology

## 2012-05-05 ENCOUNTER — Other Ambulatory Visit (HOSPITAL_COMMUNITY): Payer: Self-pay | Admitting: Psychiatry

## 2012-05-05 LAB — PRESCRIPTION ABUSE MONITORING 17P, URINE
Amphetamine/Meth: NEGATIVE ng/mL
Cannabinoid Scrn, Ur: NEGATIVE ng/mL
Cocaine Metabolites: NEGATIVE ng/mL
Creatinine, Urine: 51.14 mg/dL (ref >20.0)
Methadone Screen, Urine: NEGATIVE ng/mL
Opiate Screen, Urine: NEGATIVE ng/mL
Oxycodone Screen, Ur: NEGATIVE ng/mL
Tapentadol, urine: NEGATIVE ng/mL
Tramadol Scrn, Ur: NEGATIVE ng/mL

## 2012-05-05 LAB — ALCOHOL METABOLITE (ETG), URINE: Ethyl Glucuronide (EtG): NEGATIVE ng/mL

## 2012-05-05 NOTE — Progress Notes (Unsigned)
    Daily Group Progress Note  Program: CD-IOP   Group Time: 1-2:30 pm  Participation Level: Active  Behavioral Response: Appropriate and Sharing  Type of Therapy: Process Group  Topic: Group Process: first part of group spent in process. Members shared about the past weekend and identified any problems or positive experiences relative to their recovery. One member checked-in with a new sobriety date and recounted a terrible weekend that included her using cannabis and alcohol all 3 days. She had shared some information and concerns on Friday prior to group ending that were very problematic. The group provided support and validated her coming to group today. There was good feedback, but almost everyone agreed she was in relapse mode prior to even leaving the session on Friday. There was a new group member present and he shared about his history of addiction and what he hopes to get from this group. There was good feedback and disclosure in this session.  Group Time: 2:45-4pm  Participation Level: Active  Behavioral Response: Sharing and Assertive  Type of Therapy: Psycho-education Group  Topic: [facility]: Triggers and Cravings. Second half of group included a presentation from the [facility] program. It included a power point presentation on Triggers and Cravings. ,members discussed their own experiences as their 'conditioned responses" were developed and strengthened as their drug and alcohol use increased. As the session came to an end, group members said "good-bye" to one of their own who was entering [facility] tomorrow at 10 am. She had continued to drink, was referred to a higher level of care, and had accepted this recommendation. The group wished her well and all agreed it would surely prove to be a transformative experience for this member.   Summary: The patient arrived and appeared calmer than on Friday. She had met with the medical director on Friday and been  prescribed a medication to address what was clearly a manic phase. Today she reported she is feeling much calmer, but is still not quite where she needs to be. She applauded the member who had relapsed and told her she was proud she had come back to the group. Later on, this patient was very assertive as she reminded the same member that both she and another woman had given her their cell phone numbers and encouraged her to phone them if she was struggling at all over the weekend. The patient admitted she had been kind of outspoken over the weekend with her children at their wrestling match, but also recounted an episode with a new phone that wouldn't function and her return to [employer] to discuss the problem. The patient remains sober with her sobriety date of 12/6 reflecting a slip on one beer on the 5th. The patient continues to make good progress despite her slip. In the second half of group she admitted she was going to throw out all of her large plastic cups - they were triggers to her drinking days. She made some excellent comments and responded well to this intervention.    Family Program: Family present? No   Name of family member(s):   UDS collected: Yes Results: negative  AA/NA attended?: YesMonday, Tuesday, Wednesday, Thursday, Friday and Sunday  Sponsor?: Yes   Jeanette Bell, LCAS

## 2012-05-06 ENCOUNTER — Other Ambulatory Visit (HOSPITAL_COMMUNITY): Payer: 59 | Admitting: Psychology

## 2012-05-06 DIAGNOSIS — F1120 Opioid dependence, uncomplicated: Secondary | ICD-10-CM

## 2012-05-06 DIAGNOSIS — F1020 Alcohol dependence, uncomplicated: Secondary | ICD-10-CM

## 2012-05-06 DIAGNOSIS — F1320 Sedative, hypnotic or anxiolytic dependence, uncomplicated: Secondary | ICD-10-CM

## 2012-05-07 ENCOUNTER — Encounter (HOSPITAL_COMMUNITY): Payer: Self-pay | Admitting: Psychology

## 2012-05-07 NOTE — Progress Notes (Unsigned)
    Daily Group Progress Note  Program: CD-IOP   Group Time: 1-2:30 pm  Participation Level: Active  Behavioral Response: Appropriate and Sharing  Type of Therapy: Process Group  Topic:Group Process: first half of group was spent in process. Members shared the vents of the past 2 days since we were last in group. One member disclosed that he had stopped at an [facility] store on the way home from group Monday afternoon. He had drunk a fair amount by the time his wife and daughter got home that evening. The session reviewed the many options this member may have taken to address this urge versus actually stopping and buying the vodka. The discussion shifted to his most recent UA, which was positive for [facility]. He had never shared anything about his cannabis use and the group questioned his thinking. There was a clear disconnect between his long-time struggle with alcohol and his ongoing cannabis use. The session proved lively and engaging with members challenging one another.    Group Time: 2:45- 4pm  Participation Level: Active  Behavioral Response: Sharing  Type of Therapy: Psycho-education Group  Topic: Refusal Skills: how to handle an offer to use. The second half of group was spent discussing a handout on communication. It included specific scenarios where one is confronted and/or offered a drink or drugs. Members took turns explaining how they would respond to these scenarios. Also included was a list of specific responses refusing offers and the group went around the room reading each of them. I read a scenario including a hot day and ice cold beer and a number of group members admitted this reading had triggered thoughts of drinking. The group discussed how to address these thoughts and the importance of sharing them with others and addressing them as soon as they enter their conscious awareness.   Summary: The patient reported she was doing well. She has enjoyed returning to work and is  feeling much calmer since she began taking the new medication last Friday. She reported she continues to attend meetings at the [employer]. She provided good feedback and challenged another member about his cannabis use. The patient reported she had no problem telling people she is in recovery and it is nothing to be ashamed of. After one if the scenarios was read regarding the cold beer, the patient admitted she felt like going home and buying a 6-pack at the store and drinking it all by herself. She admitted she had really found the reading inviting and she could already taste the beer. As the session continued and the discussion about refusal skills neared the end, she was asked if she still felt like drinking? The patient stated that the craving had passed and she was going to the school and watch her boys wrestle in the match they had this evening. She provided support to another member and continues to display a very honest understanding of her addiction and what it is going to take going forward.    Family Program: Family present? No   Name of family member(s):   UDS collected: Yes Results: negative  AA/NA attended?: YesMonday, Tuesday, Saturday and Sunday  Sponsor?: Yes   Shadeed Colberg, LCAS

## 2012-05-07 NOTE — Progress Notes (Unsigned)
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 Failed a drug test at work on October 25, and couldn't remember going home. I began having suicidal thoughts."  History of Chief Complaint:  Addiction Problem  HPI Asli was recently discharged from the adult inpatient unit of [facility] after being admitted for suicidal thoughts. Review of Systems Physical Exam  Depressive Symptoms: {DEPRESSION SYMPTOMS:20000}  (Hypo) Manic Symptoms:   Elevated Mood:  {BHH YES OR NO:22294} Irritable Mood:  {BHH YES OR NO:22294} Grandiosity:  {BHH YES OR NO:22294} Distractibility:  {BHH YES OR NO:22294} Labiality of Mood:  {BHH YES OR NO:22294} Delusions:  {BHH YES OR NO:22294} Hallucinations:  {BHH YES OR NO:22294} Impulsivity:  {BHH YES OR NO:22294} Sexually Inappropriate Behavior:  {BHH YES OR NO:22294} Financial Extravagance:  {BHH YES OR NO:22294} Flight of Ideas:  {BHH YES OR NO:22294}  Anxiety Symptoms: Excessive Worry:  {BHH YES OR NO:22294} Panic Symptoms:  {BHH YES OR NO:22294} Agoraphobia:  {BHH YES OR NO:22294} Obsessive Compulsive: {BHH YES OR NO:22294}  Symptoms: {Obsessive Compulsive Symptoms:22671} Specific Phobias:  {BHH YES OR NO:22294} Social Anxiety:  {BHH YES OR NO:22294}  Psychotic Symptoms:  Hallucinations: {BHH YES OR NO:22294} {Hallucinations:22672} Delusions:  {BHH YES OR NO:22294} Paranoia:  {BHH YES OR NO:22294}   Ideas of Reference:  {BHH YES OR NO:22294}  PTSD Symptoms: Ever had a traumatic exposure:  {BHH YES OR NO:22294} Had a traumatic exposure in the last month:  {BHH YES OR NO:22294} Re-experiencing: {BHH YES OR NO:22294} {Re-experiencing:22673} Hypervigilance:  {BHH YES OR NO:22294} Hyperarousal: {BHH YES OR NO:22294} {Hyperarousal:22674} Avoidance: {BHH YES OR NO:22294} {Avoidance:22675}  Traumatic Brain Injury: {BHH  YES OR NO:22294} {Traumatic Brain Injury:22676}  Past Psychiatric History: Diagnosis: ***  Hospitalizations: ***  Outpatient Care: ***  Substance Abuse Care: ***  Self-Mutilation: ***  Suicidal Attempts: ***  Violent Behaviors: ***   Past Medical History:   Past Medical History  Diagnosis Date  . Chronic hip pain     left hip. treated with radiofrequency  . Depression   . Bipolar 1 disorder, depressed 03/18/2012   History of Loss of Consciousness:  {BHH YES OR NO:22294} Seizure History:  {BHH YES OR NO:22294} Cardiac History:  {BHH YES OR NO:22294} Allergies:  No Known Allergies Current Medications:  Current Outpatient Prescriptions  Medication Sig Dispense Refill  . acetaminophen (TYLENOL) 500 MG tablet Take 1,000 mg by mouth every 6 (six) hours as needed. For pain      . hydrOXYzine (ATARAX/VISTARIL) 25 MG tablet TAKE 1 TABLET (25 MG TOTAL) BY MOUTH EVERY 6 (SIX) HOURS AS NEEDED FOR ANXIETY.  30 tablet  0  . hydrOXYzine (ATARAX/VISTARIL) 50 MG tablet TAKE 1 TABLET (50 MG TOTAL) BY MOUTH AT BEDTIME AND MAY REPEAT DOSE ONE TIME IF NEEDED. FOR SLEEP  30 tablet  0  . lamoTRIgine (LAMICTAL) 200 MG tablet TAKE 2 TABLETS (400 MG TOTAL) BY MOUTH DAILY. FOR MOOD STABILIZATION  60 tablet  0  . risperiDONE (RISPERDAL) 1 MG tablet Take one to two tablets at bedime  60 tablet  0  . topiramate (TOPAMAX) 25 MG tablet Take 1 tablet (25 mg total) by mouth 2 (two) times daily.  60 tablet  0  . venlafaxine (EFFEXOR) 75 MG tablet Take 1 tablet (75 mg total) by mouth daily. For depression and anxiety  30 tablet  0    Previous Psychotropic Medications:  Medication Dose   ***  ***  Substance Abuse History in the last 12 months: Substance Age of 1st Use Last Use Amount Specific Type  Nicotine  ***  ***  ***  ***  Alcohol  ***  ***  ***  ***  Cannabis  ***  ***  ***  ***  Opiates  ***  ***  ***  ***  Cocaine  ***  ***  ***  ***  Methamphetamines  ***  ***  ***  ***   LSD  ***  ***  ***  ***  Ecstasy  ***   ***  ***  ***  Benzodiazepines  ***  ***  ***  ***  Caffeine  ***  ***  ***  ***  Inhalants  ***  ***  ***  ***  Others:                          Medical Consequences of Substance Abuse: ***  Legal Consequences of Substance Abuse: ***  Family Consequences of Substance Abuse: ***  Blackouts:  {BHH YES OR NO:22294} DT's:  {BHH YES OR NO:22294} Withdrawal Symptoms:  {BHH YES OR NO:22294} {Withdrawal Symptoms:22677}  Social History: Current Place of Residence: *** Place of Birth: *** Family Members: *** Marital Status:  {Marital Status:22678} Children: ***  Sons: ***  Daughters: *** Relationships: *** Education:  {Education:22679} Educational Problems/Performance: *** Religious Beliefs/Practices: *** History of Abuse: {Desc; abuse:16542} Occupational Experiences; Military History:  {Military History:22680} Legal History: *** Hobbies/Interests: ***  Family History:   Family History  Problem Relation Age of Onset  . Depression Mother   . Alcohol abuse Father   . Bipolar disorder Sister   . Depression Maternal Aunt   . Depression Maternal Grandmother     Mental Status Examination/Evaluation: Objective:  Appearance: {Appearance:22683}  Eye Contact::  {BHH EYE CONTACT:22684}  Speech:  {Speech:22685}  Volume:  {Volume (PAA):22686}  Mood:  ***  Affect:  {Affect (PAA):22687}  Thought Process:  {Thought Process (PAA):22688}  Orientation:  {BHH ORIENTATION (PAA):22689}  Thought Content:  {Thought Content:22690}  Suicidal Thoughts:  {ST/HT (PAA):22692}  Homicidal Thoughts:  {ST/HT (PAA):22692}  Judgement:  {Judgement (PAA):22694}  Insight:  {Insight (PAA):22695}  Psychomotor Activity:  {Psychomotor (PAA):22696}  Akathisia:  {BHH YES OR NO:22294}  Handed:  {Handed:22697}  AIMS (if indicated):  ***  Assets:  {Assets (PAA):22698}    Laboratory/X-Ray Psychological Evaluation(s)   ***  ***   Assessment:  {axis  diagnosis:3049000}  AXIS I {psych axis 1:31909}  AXIS II {psych axis 2:31910}  AXIS III Past Medical History  Diagnosis Date  . Chronic hip pain     left hip. treated with radiofrequency  . Depression   . Bipolar 1 disorder, depressed 03/18/2012     AXIS IV {psych axis iv:31915}  AXIS V {psych axis v score:31919}   Treatment Plan/Recommendations:  Plan of Care: ***  Laboratory:  {Laboratory:22682}  Psychotherapy: ***  Medications: ***  Routine PRN Medications:  {BHH YES OR NO:22294}  Consultations: ***  Safety Concerns:  ***  Other:      [facility] 12/19/201310:27 AM

## 2012-05-08 ENCOUNTER — Other Ambulatory Visit (HOSPITAL_COMMUNITY): Payer: 59 | Admitting: Psychology

## 2012-05-08 DIAGNOSIS — F1120 Opioid dependence, uncomplicated: Secondary | ICD-10-CM

## 2012-05-08 DIAGNOSIS — F1320 Sedative, hypnotic or anxiolytic dependence, uncomplicated: Secondary | ICD-10-CM

## 2012-05-08 DIAGNOSIS — F1020 Alcohol dependence, uncomplicated: Secondary | ICD-10-CM

## 2012-05-08 NOTE — Progress Notes (Unsigned)
Psychiatric Assessment Adult  Patient Identification:  Jeanette Bell Date of Evaluation:  05/08/2012 Chief Complaint: *** History of Chief Complaint:  No chief complaint on file.   HPI Review of Systems Physical Exam  Depressive Symptoms: {DEPRESSION SYMPTOMS:20000}  (Hypo) Manic Symptoms:   Elevated Mood:  {BHH YES OR NO:22294} Irritable Mood:  {BHH YES OR NO:22294} Grandiosity:  {BHH YES OR NO:22294} Distractibility:  {BHH YES OR NO:22294} Labiality of Mood:  {BHH YES OR NO:22294} Delusions:  {BHH YES OR NO:22294} Hallucinations:  {BHH YES OR NO:22294} Impulsivity:  {BHH YES OR NO:22294} Sexually Inappropriate Behavior:  {BHH YES OR NO:22294} Financial Extravagance:  {BHH YES OR NO:22294} Flight of Ideas:  {BHH YES OR NO:22294}  Anxiety Symptoms: Excessive Worry:  {BHH YES OR NO:22294} Panic Symptoms:  {BHH YES OR NO:22294} Agoraphobia:  {BHH YES OR NO:22294} Obsessive Compulsive: {BHH YES OR NO:22294}  Symptoms: {Obsessive Compulsive Symptoms:22671} Specific Phobias:  {BHH YES OR NO:22294} Social Anxiety:  {BHH YES OR NO:22294}  Psychotic Symptoms:  Hallucinations: {BHH YES OR NO:22294} {Hallucinations:22672} Delusions:  {BHH YES OR NO:22294} Paranoia:  {BHH YES OR NO:22294}   Ideas of Reference:  {BHH YES OR NO:22294}  PTSD Symptoms: Ever had a traumatic exposure:  {BHH YES OR NO:22294} Had a traumatic exposure in the last month:  {BHH YES OR NO:22294} Re-experiencing: {BHH YES OR NO:22294} {Re-experiencing:22673} Hypervigilance:  {BHH YES OR NO:22294} Hyperarousal: {BHH YES OR NO:22294} {Hyperarousal:22674} Avoidance: {BHH YES OR NO:22294} {Avoidance:22675}  Traumatic Brain Injury: {BHH YES OR NO:22294} {Traumatic Brain Injury:22676}  Past Psychiatric History: Diagnosis: ***  Hospitalizations: ***  Outpatient Care: ***  Substance Abuse Care: ***  Self-Mutilation: ***  Suicidal Attempts: ***  Violent Behaviors: ***   Past Medical History:   Past  Medical History  Diagnosis Date  . Chronic hip pain     left hip. treated with radiofrequency  . Depression   . Bipolar 1 disorder, depressed 03/18/2012   History of Loss of Consciousness:  {BHH YES OR NO:22294} Seizure History:  {BHH YES OR NO:22294} Cardiac History:  {BHH YES OR NO:22294} Allergies:  No Known Allergies Current Medications:  Current Outpatient Prescriptions  Medication Sig Dispense Refill  . acetaminophen (TYLENOL) 500 MG tablet Take 1,000 mg by mouth every 6 (six) hours as needed. For pain      . hydrOXYzine (ATARAX/VISTARIL) 25 MG tablet TAKE 1 TABLET (25 MG TOTAL) BY MOUTH EVERY 6 (SIX) HOURS AS NEEDED FOR ANXIETY.  30 tablet  0  . hydrOXYzine (ATARAX/VISTARIL) 50 MG tablet TAKE 1 TABLET (50 MG TOTAL) BY MOUTH AT BEDTIME AND MAY REPEAT DOSE ONE TIME IF NEEDED. FOR SLEEP  30 tablet  0  . lamoTRIgine (LAMICTAL) 200 MG tablet TAKE 2 TABLETS (400 MG TOTAL) BY MOUTH DAILY. FOR MOOD STABILIZATION  60 tablet  0  . risperiDONE (RISPERDAL) 1 MG tablet Take one to two tablets at bedime  60 tablet  0  . topiramate (TOPAMAX) 25 MG tablet Take 1 tablet (25 mg total) by mouth 2 (two) times daily.  60 tablet  0  . venlafaxine (EFFEXOR) 75 MG tablet Take 1 tablet (75 mg total) by mouth daily. For depression and anxiety  30 tablet  0    Previous Psychotropic Medications:  Medication Dose   ***  ***                     Substance Abuse History in the last 12 months: Substance Age of 1st Use Last Use Amount Specific  Type  Nicotine  ***  ***  ***  ***  Alcohol  ***  ***  ***  ***  Cannabis  ***  ***  ***  ***  Opiates  ***  ***  ***  ***  Cocaine  ***  ***  ***  ***  Methamphetamines  ***  ***  ***  ***  LSD  ***  ***  ***  ***  Ecstasy  ***   ***  ***  ***  Benzodiazepines  ***  ***  ***  ***  Caffeine  ***  ***  ***  ***  Inhalants  ***  ***  ***  ***  Others:                          Medical Consequences of Substance Abuse: ***  Legal Consequences of  Substance Abuse: ***  Family Consequences of Substance Abuse: ***  Blackouts:  {BHH YES OR NO:22294} DT's:  {BHH YES OR NO:22294} Withdrawal Symptoms:  {BHH YES OR NO:22294} {Withdrawal Symptoms:22677}  Social History: Current Place of Residence: *** Place of Birth: *** Family Members: *** Marital Status:  {Marital Status:22678} Children: ***  Sons: ***  Daughters: *** Relationships: *** Education:  {Education:22679} Educational Problems/Performance: *** Religious Beliefs/Practices: *** History of Abuse: {Desc; abuse:16542} Occupational Experiences; Military History:  {Military History:22680} Legal History: *** Hobbies/Interests: ***  Family History:   Family History  Problem Relation Age of Onset  . Depression Mother   . Alcohol abuse Father   . Bipolar disorder Sister   . Depression Maternal Aunt   . Depression Maternal Grandmother     Mental Status Examination/Evaluation: Objective:  Appearance: {Appearance:22683}  Eye Contact::  {BHH EYE CONTACT:22684}  Speech:  {Speech:22685}  Volume:  {Volume (PAA):22686}  Mood:  ***  Affect:  {Affect (PAA):22687}  Thought Process:  {Thought Process (PAA):22688}  Orientation:  {BHH ORIENTATION (PAA):22689}  Thought Content:  {Thought Content:22690}  Suicidal Thoughts:  {ST/HT (PAA):22692}  Homicidal Thoughts:  {ST/HT (PAA):22692}  Judgement:  {Judgement (PAA):22694}  Insight:  {Insight (PAA):22695}  Psychomotor Activity:  {Psychomotor (PAA):22696}  Akathisia:  {BHH YES OR NO:22294}  Handed:  {Handed:22697}  AIMS (if indicated):  ***  Assets:  {Assets (PAA):22698}    Laboratory/X-Ray Psychological Evaluation(s)   ***  ***   Assessment:  {axis diagnosis:3049000}  AXIS I {psych axis 1:31909}  AXIS II {psych axis 2:31910}  AXIS III Past Medical History  Diagnosis Date  . Chronic hip pain     left hip. treated with radiofrequency  . Depression   . Bipolar 1 disorder, depressed 03/18/2012     AXIS IV {psych axis  iv:31915}  AXIS V {psych axis v score:31919}   Treatment Plan/Recommendations:  Plan of Care: ***  Laboratory:  {Laboratory:22682}  Psychotherapy: ***  Medications: ***  Routine PRN Medications:  {BHH YES OR NO:22294}  Consultations: ***  Safety Concerns:  ***  Other:      [facility] 12/20/201311:18 AM

## 2012-05-11 ENCOUNTER — Encounter (HOSPITAL_COMMUNITY): Payer: Self-pay

## 2012-05-11 ENCOUNTER — Encounter (HOSPITAL_COMMUNITY): Payer: Self-pay | Admitting: Psychology

## 2012-05-11 ENCOUNTER — Other Ambulatory Visit (HOSPITAL_COMMUNITY): Payer: 59 | Admitting: Psychology

## 2012-05-11 DIAGNOSIS — F1120 Opioid dependence, uncomplicated: Secondary | ICD-10-CM

## 2012-05-11 DIAGNOSIS — F1320 Sedative, hypnotic or anxiolytic dependence, uncomplicated: Secondary | ICD-10-CM
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
## 2012-05-11 NOTE — Progress Notes (Unsigned)
    Daily Group Progress Note  Program: CD-IOP   Group Time: 1-2:30 pm  Participation Level: Minimal  Behavioral Response: Appropriate  Type of Therapy: Psycho-education Group  Topic: Guest Speaker: the first part of group was spent in a brief check-in and the arrival of a guest speaker. The guest speaker had been a former group member who had recently picked up his 6 month chip. He briefly recounted his long history of addiction, but focused more on what has occurred in the 6 months since he got sober. This former patient shared about his devotion to NA, the fellowship, securing a sponsor and working the steps. He emphasized the importance of "only for today" and provided encouragement to the group members. He fielded a few questions at the conclusion of his talk and the group thanked him for sharing.   Group Time: 2:45- 4pm  Participation Level: Active  Behavioral Response: Appropriate and Sharing  Type of Therapy: Process Group  Topic:Group Process/Introductions: the second half of group was spent in process. One member had been spoken to as the first session ended and he had admitted he was high. He was asked to stay in the lobby until the group was over and he was more capable of driving. I explained to the group that in the future, if anyone observes a fellow group member that appears high, they should immediately share their observations. It is a real trigger to observe other people high and it is unacceptable to be in group high with others in recovery. Two new group members were present and they were asked to briefly introduce themselves. There was good feedback and the new members appeared to be in the right place. Another member shared that he may not return to group on Monday. He questioned whether he needs to be here and he received good feedback and questions from members. The session proved insightful and included some good exchanges.     Summary: the patient brought her 29  yo daughter, [name], today. She has shared a lot about her children and her concerns for their genetic predisposition to addiction. The patient was attentive and agreed that the speaker was very motivating. In process, she shared openly about her own struggles. She reminded another member that relapse happens and she had felt like 'shit' when she drank a beer, but she had returned to group and started fresh. The patient continues to make good progress and is very engaged in the 12-step movement. She seems to understand the consequences of continued drug and alcohol use and has made good changes since joining the program. The daughter made some good comments and appears to have learned from her mother.    Family Program: Family present? Yes   Name of family member(s): [name], the patient's 80 yo daughter  UDS collected: No Results:   AA/NA attended?: YesMonday, Tuesday, Wednesday and Thursday  Sponsor?: Yes   Jeanette Bell, LCAS

## 2012-05-13 ENCOUNTER — Other Ambulatory Visit (HOSPITAL_COMMUNITY): Payer: 59

## 2012-05-15 ENCOUNTER — Encounter (HOSPITAL_COMMUNITY): Payer: Self-pay | Admitting: Psychology

## 2012-05-15 ENCOUNTER — Other Ambulatory Visit (HOSPITAL_COMMUNITY): Payer: 59 | Admitting: Psychology

## 2012-05-15 DIAGNOSIS — F1320 Sedative, hypnotic or anxiolytic dependence, uncomplicated: Secondary | ICD-10-CM

## 2012-05-15 DIAGNOSIS — F1020 Alcohol dependence, uncomplicated: Secondary | ICD-10-CM

## 2012-05-15 DIAGNOSIS — F1120 Opioid dependence, uncomplicated: Secondary | ICD-10-CM

## 2012-05-15 DIAGNOSIS — F1920 Other psychoactive substance dependence, uncomplicated: Secondary | ICD-10-CM

## 2012-05-15 NOTE — Progress Notes (Signed)
    Daily Group Progress Note  Program: CD-IOP   Group Time: 1-2:30 pm  Participation Level: Active  Behavioral Response: Appropriate and Sharing  Type of Therapy: Process Group  Topic: Group Process: First part of group was spent in process. Members shared about current issues and concerns. One member shared about her brother and their relationship. Another member disclosed having a blow-up with his mother. Another member shared that her husband seems more supportive of her recovery after having met with him and me for a session. The 2 female members present both noted that their "good to" emotion is anger. The group discussed what emotion might be underlying the anger. Members were encouraged not to re-enact their adolescence. There was good disclosure and feedback among the group.   Group Time: 2:45- 4pm  Participation Level: Active  Behavioral Response: Sharing  Type of Therapy: Psycho-education Group  Topic: Remaining clean and sober over the Holiday: Second half of group was spent in a discussion on the importance of remaining vigilant towards one's recovery during the upcoming hoiday week. Members shared their plans for Christmas and identified the ways in which they would remain alcohol and drug-free. They included plans in case something changed or became too challenging or tempting. Each member seemed very confident that he or she would have lots of support and remain abstinent.  Summary: The patient shared that she had had a panic attack of sorts while at her father's family's holiday celebration. She described being in a small house, with many people, and lots of crying children. She had found herself struggling to get out of the room and go into an empty room. Her father had found her crying. She admitted she was overwhelmed by all of the noise and people. I pointed out that this was the first Christmas in probably 15+ years where she wasn't taking pain pills or Xanax and it  probably was overstimlating. She shared the argument she had had with her husband upon their return and admitted that she wouldn't be surprised if he told her to leave the house later this evening when he came home. The group questioned whether that would actually happen, but the patient seemed certain there would be a major confrontation. She has continued to report her husband's lack of concern or acceptance of her addiction and mood disorder and he only seems interested in her returning to work full-time and getting the income they need. The group provided good support and validation to this patient and it seemed clear she felt heard. She has few plans over the next 2 days and is certain there will be no alcohol. The patient remains with a sobriety date of 12/16.   Family Program: Family present? No   Name of family member(s):   UDS collected: No Results:   AA/NA attended?: YesMonday, Tuesday, Wednesday, Thursday, Friday, Saturday and Sunday  Sponsor?: Yes   Cherylin Waguespack, LCAS

## 2012-05-16 LAB — PRESCRIPTION ABUSE MONITORING 17P, URINE
Amphetamine/Meth: NEGATIVE ng/mL
Buprenorphine, Urine: NEGATIVE ng/mL
Carisoprodol, Urine: NEGATIVE ng/mL
Creatinine, Urine: 77.13 mg/dL (ref >20.0)
Methadone Screen, Urine: NEGATIVE ng/mL
Opiate Screen, Urine: NEGATIVE ng/mL
Oxycodone Screen, Ur: NEGATIVE ng/mL
Propoxyphene: NEGATIVE ng/mL

## 2012-05-16 LAB — ALCOHOL METABOLITE (ETG), URINE: Ethyl Glucuronide (EtG): NEGATIVE ng/mL

## 2012-05-18 ENCOUNTER — Encounter (HOSPITAL_COMMUNITY): Payer: Self-pay | Admitting: Psychology

## 2012-05-18 ENCOUNTER — Other Ambulatory Visit (HOSPITAL_COMMUNITY): Payer: 59 | Admitting: Psychology

## 2012-05-18 DIAGNOSIS — F319 Bipolar disorder, unspecified: Secondary | ICD-10-CM

## 2012-05-18 DIAGNOSIS — F1320 Sedative, hypnotic or anxiolytic dependence, uncomplicated: Secondary | ICD-10-CM

## 2012-05-18 DIAGNOSIS — F1120 Opioid dependence, uncomplicated: Secondary | ICD-10-CM

## 2012-05-18 DIAGNOSIS — F1020 Alcohol dependence, uncomplicated: Secondary | ICD-10-CM

## 2012-05-18 NOTE — Progress Notes (Signed)
    Daily Group Progress Note  Program: CD-IOP   Group Time: 1-2:30 pm  Participation Level: Active  Behavioral Response: Sharing  Type of Therapy: Psycho-education Group  Topic: Why You can't Do It Yourself: first part of group was spent in psychoeduation. A presentation was provided on the importance of engaging with others and sharing your feelings, thoughts and experiences with other people, preferably in recovery. The idea was offered to the group about the following: "You have a disease, it lives in your head, and it speaks to you through your thoughts". if members accept this to be true, and almost everyone agreed that their addiction "talks" to them, then it is very difficulty to identify if it is your addiction, or your sober self, that is generating your thoughts. The session was intense and members shared openly about their own experiences. Also present was the Wellsite geologist, who shared his beliefs about this topic and the importance of carrying on dialogue with others in recovery.  Group Time: 2:45- 4pm  Participation Level: Active  Behavioral Response: Sharing  Type of Therapy: Process Group  Topic: Group Process/Introduction: the second half of group was spent in process. Members shared about their current issues and struggles in recovery. One member had drank since the last group and he recounted the events leading up to his use. The group processed possible interventions that could have been taken to avoid the relapse. Another group member was new to the group and he shared about his past drug use and the events that had brought him here. There was good disclosure and feedback. Prior to the group ending, each member shared what he/she intended to do to remain alcohol and drug-free over this holiday weekend.   Summary: The patient reported she is attending meetings at the [employer] and does share her experiences in meetings. She expressed frustration with her husband  and his lack of support of her recovery and treatment. The patient reported he is complaining about money and she shared that he has been out of work because there isn't any and her part-time paycheck just doesn't meet the family's needs. When asked about the upcoming weekend, the patient reported [place]. The patient reported she didn't feel good and is very stressed out. She received good feedback and support from group members, who also express frustration with her husband's seeming lack of interest. Despite a slip in early December, the patient displays an excellent understanding of her daily recovery needs and continues with her 12/6 sobriety date.    Family Program: Family present? No   Name of family member(s):   UDS collected: Yes Results: negative  AA/NA attended?: YesTuesday and Thursday  Sponsor?: Yes   Vidalia Serpas, LCAS

## 2012-05-19 ENCOUNTER — Encounter (HOSPITAL_COMMUNITY): Payer: Self-pay | Admitting: Psychology

## 2012-05-19 NOTE — Progress Notes (Unsigned)
Patient ID: Jeanette Bell, female   DOB: Dec 06, 1970, 41 y.o.   MRN: [id number] Discharge from CD-IOP: I met with the patient this morning as scheduled. She reported she had to go back to work full-time and that Friday would be her last day. She has done well in the program and will have attended 21 group sessions. Based on her attendance, engagement in group and individual therapy, attendance in 12-step meetings and securing a sponsor, she will be completing the program successfully. We reviewed her goals to date, which include sobriety, building recovery support, and to return to work. The patient slipped once since entering the program and her sobriety date remains 12/6. She has done an excellent job attending 12-step meetings and has secured a sponsor. She goes to the [employer] and anticipates attending the "Happy Hour" at 5:30 once she is working full-time. She returned to work a few weeks ago and has been working 20 hours per week. She apologized today, but noted that her husband has been out of work for the past 2 weeks and her 20 hour weekly salary is not going to meet the expenses of a 3 children, 5 family-member household with the mortgage, bills, and other life expenses. She reported she will continue to work her daily recovery plan and insure that she is taking good care of herself. Historically, this patient has always taken care of everyone else and what was left for her would have to be acceptable. Today she knows that she is #1 and she promised to get her exercise, eat well, and insure she is getting enough sleep. The Discharge Plan was reviewed and signed and the patient will complete her treatment and be discharged on Friday, May 22, 2012. She has chosen to continue to meet with Jorje Guild, the medical director, and he will monitor her psychiatric medications for Bipolar Disorder. She is scheduled to meet with him on June 15, 2012 at 3:30 pm.
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
## 2012-05-21 ENCOUNTER — Encounter (HOSPITAL_COMMUNITY): Payer: Self-pay | Admitting: Psychology

## 2012-05-21 NOTE — Progress Notes (Signed)
    Daily Group Progress Note  Program: CD-IOP   Group Time: 1-2:30 pm  Participation Level: Active  Behavioral Response: Sharing  Type of Therapy: Process Group  Topic: Group Process: first part of group was spent in process. Members shared about current struggles and issues in early recovery. Members discussed the past weekend. While some had attended 12-step meetings, other members had not gone to any. I questioned their lack of commitment and wondered why they couldn't spend 1 hour per day addressing their recovery? I pointed out they had spent many hours per day getting high.   Group Time: 2:45-4 pm  Participation Level: Active  Behavioral Response: Appropriate and Sharing  Type of Therapy: Psycho-education Group  Topic: Identifying things you Value and Inner Strengths: second half of group was spent in a psych education session. A handout was provided asking members to identify "Things I Value" now in recovery along with having them "Inner Strengths". There was some confusion about values now versus while in their active addiction, because clearly they did not care about most things other than their drugs, alcohol and getting high. Many members had a difficult time identifying inner strengths and asked me to explain that one. There was a good discussion with members identifying life, health, family, integrity, and friends as just some of their values. The session was lively with good disclosure and members opened up and shared more about their inner selves than in the past. At the conclusion of the session, members shared their plans for the upcoming New Year's Celebration and how the planned on spending their evening.   Summary:The patient reported she is doing okay, but the family's finances are bad and she can't continue to work part-time if they are going to pay their bills. She noted that her husband complained yesterday and wondered when she was going to get back to full-time  hours? The patient explained that he hasn't worked in 2 weeks and things are very tight. She provided good feedback to her fellow group members and noted that going into detox upstairs had helped her in ways outpatient couldn't have. The patient reported she would probably be in bed before midnight, but she had often enjoyed watching the "ball drop" on TV. She identified health as being the most important value and that being accountable and present in her daily life with family and work as inner strengths that she values. The patient remains alcohol and drug-free without support from her husband. Her sobriety date remains 12/6.    Family Program: Family present? No   Name of family member(s):   UDS collected: No Results  AA/NA attended?: No, patient admitted she had been with family at wrestling tournament all weekend.   Sponsor?: Yes   Jadis Pitter, LCAS

## 2012-05-22 ENCOUNTER — Other Ambulatory Visit (HOSPITAL_COMMUNITY): Payer: 59 | Attending: Psychiatry | Admitting: Psychology

## 2012-05-22 DIAGNOSIS — F1020 Alcohol dependence, uncomplicated: Secondary | ICD-10-CM | POA: Insufficient documentation

## 2012-05-22 DIAGNOSIS — F1320 Sedative, hypnotic or anxiolytic dependence, uncomplicated: Secondary | ICD-10-CM

## 2012-05-22 DIAGNOSIS — F1920 Other psychoactive substance dependence, uncomplicated: Secondary | ICD-10-CM | POA: Insufficient documentation

## 2012-05-22 DIAGNOSIS — F1120 Opioid dependence, uncomplicated: Secondary | ICD-10-CM

## 2012-05-22 DIAGNOSIS — F319 Bipolar disorder, unspecified: Secondary | ICD-10-CM | POA: Insufficient documentation

## 2012-05-25 ENCOUNTER — Encounter (HOSPITAL_COMMUNITY): Payer: Self-pay | Admitting: Psychology

## 2012-05-25 ENCOUNTER — Other Ambulatory Visit (HOSPITAL_COMMUNITY): Payer: 59

## 2012-05-25 NOTE — Progress Notes (Signed)
    Daily Group Progress Note  Program: CD-IOP   Group Time: 1-2:30 pm  Participation Level: Active  Behavioral Response: Sharing  Type of Therapy: Psycho-education Group  Topic: The Relapse Process: Setting Yourself Up for Relapse. The first part of this group session was a psych educational piece on relapse and ways that people set themselves up to use. One member had checked-in with a new sobriety date and described his relapse on New Year's Eve. The group identified the many red flags that had been displayed prior to his actual use. Had this patient been paying better attention and sharing about this thoughts and feelings, he may have easily avoided the relapse. Instead, there was a failure to observe regular daily aspects of his recovery prior to using. This generated an excellent discussion about knowing one's vulnerabilities and how one must plan and protect one's self against them. There was good disclosure and sharing and the relapsing patient received good support and feedback.   Group Time: 2:45- 4pm  Participation Level: Active  Behavioral Response: Appropriate and Sharing  Type of Therapy: Process Group  Topic: Group Process/Introduction/Graduation: Members shared about current issues and concerns. There was good discussion about typical problems experienced in early recovery. A new group member was present for her first day. She shared about her drinking and her struggles staying sober. At the end of the session, two graduating members were honored as they completed the program. There were kind words shared and the graduating members shared about the benefits of the group and how grateful they were to have been here.   Summary: The patient was very supportive of the member who disclosed his relapse and she reported that it was really brave of him to come to group and share about it. She applauded him and noted that he probably felt much better because she did after she  relapsed. In process, the patient reported she had found the group to be far more helpful and beneficial than she had imagined. She admitted she didn't want to leave, but her family needed her to work full-time because money was getting very tight. She received strong words of support and gratitude for her caring and kindness through the program. She reported she wished the remaining group members to get all they could from the program and be as honest as they could possibly be. She made some excellent comments and graduates successfully from the program.    Family Program: Family present? No   Name of family member(s):   UDS collected: No Results:  AA/NA attended?: YesMonday, Tuesday and Thursday  Sponsor?: Yes   Kimberlin Scheel, LCAS

## 2012-05-27 ENCOUNTER — Other Ambulatory Visit (HOSPITAL_COMMUNITY): Payer: 59

## 2012-05-27 ENCOUNTER — Other Ambulatory Visit (HOSPITAL_COMMUNITY): Payer: Self-pay | Admitting: Physician Assistant

## 2012-05-29 ENCOUNTER — Other Ambulatory Visit (HOSPITAL_COMMUNITY): Payer: 59

## 2012-06-01 ENCOUNTER — Other Ambulatory Visit (HOSPITAL_COMMUNITY): Payer: 59

## 2012-06-04 ENCOUNTER — Other Ambulatory Visit (HOSPITAL_COMMUNITY): Payer: Self-pay | Admitting: Physician Assistant

## 2012-06-15 ENCOUNTER — Ambulatory Visit (INDEPENDENT_AMBULATORY_CARE_PROVIDER_SITE_OTHER): Payer: 59 | Admitting: Physician Assistant

## 2012-06-15 ENCOUNTER — Encounter (HOSPITAL_COMMUNITY): Payer: Self-pay | Admitting: Physician Assistant

## 2012-06-15 DIAGNOSIS — F319 Bipolar disorder, unspecified: Secondary | ICD-10-CM

## 2012-06-15 DIAGNOSIS — F3110 Bipolar disorder, current episode manic without psychotic features, unspecified: Secondary | ICD-10-CM

## 2012-06-15 MED ORDER — RISPERIDONE 1 MG PO TABS: 1.0000 mg | ORAL_TABLET | Freq: Every day | ORAL | Status: DC

## 2012-06-15 MED ORDER — VENLAFAXINE HCL 75 MG PO TABS: 75.0000 mg | ORAL_TABLET | Freq: Every day | ORAL | Status: DC

## 2012-06-15 MED ORDER — TOPIRAMATE 25 MG PO TABS: ORAL_TABLET | ORAL | Status: DC

## 2012-06-15 MED ORDER — HYDROXYZINE HCL 25 MG PO TABS: 25.0000 mg | ORAL_TABLET | Freq: Four times a day (QID) | ORAL | Status: DC | PRN

## 2012-06-15 MED ORDER — HYDROXYZINE HCL 50 MG PO TABS: 50.0000 mg | ORAL_TABLET | Freq: Every day | ORAL | Status: DC

## 2012-06-15 MED ORDER — LAMOTRIGINE 200 MG PO TABS: 400.0000 mg | ORAL_TABLET | Freq: Every day | ORAL | Status: DC

## 2012-06-27 ENCOUNTER — Other Ambulatory Visit (HOSPITAL_COMMUNITY): Payer: Self-pay | Admitting: Physician Assistant

## 2012-07-07 ENCOUNTER — Other Ambulatory Visit (HOSPITAL_COMMUNITY): Payer: Self-pay | Admitting: Physician Assistant

## 2012-08-13 ENCOUNTER — Other Ambulatory Visit (HOSPITAL_COMMUNITY): Payer: Self-pay | Admitting: Physician Assistant

## 2012-08-17 ENCOUNTER — Encounter (HOSPITAL_COMMUNITY): Payer: Self-pay | Admitting: Physician Assistant

## 2012-08-18 ENCOUNTER — Encounter (HOSPITAL_COMMUNITY): Payer: Self-pay | Admitting: Physician Assistant
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
## 2012-08-19 ENCOUNTER — Other Ambulatory Visit (HOSPITAL_COMMUNITY): Payer: Self-pay | Admitting: *Deleted

## 2012-08-19 ENCOUNTER — Ambulatory Visit (HOSPITAL_COMMUNITY): Payer: Self-pay | Admitting: Physician Assistant

## 2012-08-19 NOTE — Telephone Encounter (Signed)
See phone note

## 2012-08-24 ENCOUNTER — Telehealth (HOSPITAL_COMMUNITY): Payer: Self-pay | Admitting: *Deleted

## 2012-08-24 MED ORDER — TOPIRAMATE 100 MG PO TABS: 100.0000 mg | ORAL_TABLET | Freq: Two times a day (BID) | ORAL | Status: DC

## 2012-08-24 NOTE — Telephone Encounter (Signed)
Pt left ZO:XWRU was rescheduled due to provider,.Requested refill of Topiramate.Asks that a [facility] to [facility] and the rest go to [facility]

## 2012-08-29 ENCOUNTER — Other Ambulatory Visit (HOSPITAL_COMMUNITY): Payer: Self-pay | Admitting: Physician Assistant

## 2012-08-31 ENCOUNTER — Other Ambulatory Visit (HOSPITAL_COMMUNITY): Payer: Self-pay | Admitting: Physician Assistant

## 2012-08-31 MED ORDER — TOPIRAMATE 100 MG PO TABS: 100.0000 mg | ORAL_TABLET | Freq: Two times a day (BID) | ORAL | Status: DC

## 2012-09-01 ENCOUNTER — Other Ambulatory Visit (HOSPITAL_COMMUNITY): Payer: Self-pay | Admitting: *Deleted

## 2012-09-01 NOTE — Telephone Encounter (Signed)
Patient left VM: Needs refill on Risperdal sent to [facility], not [facility].Per insurance, must be mail order.

## 2012-09-14 ENCOUNTER — Telehealth (HOSPITAL_COMMUNITY): Payer: Self-pay

## 2012-09-17 ENCOUNTER — Other Ambulatory Visit (HOSPITAL_COMMUNITY): Payer: Self-pay | Admitting: Physician Assistant

## 2012-09-17 MED ORDER — HYDROXYZINE HCL 50 MG PO TABS: 50.0000 mg | ORAL_TABLET | Freq: Every day | ORAL | Status: DC

## 2012-09-17 MED ORDER — VENLAFAXINE HCL 75 MG PO TABS: 75.0000 mg | ORAL_TABLET | Freq: Every day | ORAL | Status: DC

## 2012-09-17 MED ORDER — HYDROXYZINE HCL 25 MG PO TABS: 25.0000 mg | ORAL_TABLET | Freq: Four times a day (QID) | ORAL | Status: DC | PRN

## 2012-09-17 MED ORDER — LAMOTRIGINE 200 MG PO TABS: 400.0000 mg | ORAL_TABLET | Freq: Every day | ORAL | Status: DC

## 2012-09-17 MED ORDER — RISPERIDONE 1 MG PO TABS: ORAL_TABLET | ORAL | Status: DC

## 2012-09-30 ENCOUNTER — Ambulatory Visit (INDEPENDENT_AMBULATORY_CARE_PROVIDER_SITE_OTHER): Payer: 59 | Admitting: Physician Assistant

## 2012-09-30 DIAGNOSIS — F1921 Other psychoactive substance dependence, in remission: Secondary | ICD-10-CM

## 2012-09-30 DIAGNOSIS — F319 Bipolar disorder, unspecified: Secondary | ICD-10-CM

## 2012-09-30 DIAGNOSIS — F1310 Sedative, hypnotic or anxiolytic abuse, uncomplicated: Secondary | ICD-10-CM

## 2012-09-30 DIAGNOSIS — F1120 Opioid dependence, uncomplicated: Secondary | ICD-10-CM

## 2012-09-30 DIAGNOSIS — F1020 Alcohol dependence, uncomplicated: Secondary | ICD-10-CM

## 2012-09-30 DIAGNOSIS — F411 Generalized anxiety disorder: Secondary | ICD-10-CM

## 2012-09-30 MED ORDER — GABAPENTIN 300 MG PO CAPS: 300.0000 mg | ORAL_CAPSULE | Freq: Three times a day (TID) | ORAL | Status: DC

## 2012-10-02 ENCOUNTER — Telehealth (HOSPITAL_COMMUNITY): Payer: Self-pay

## 2012-10-02 ENCOUNTER — Other Ambulatory Visit (HOSPITAL_COMMUNITY): Payer: Self-pay | Admitting: Physician Assistant

## 2012-10-02 MED ORDER — LAMOTRIGINE 200 MG PO TABS: 400.0000 mg | ORAL_TABLET | Freq: Every day | ORAL | Status: DC

## 2012-10-02 MED ORDER — HYDROXYZINE HCL 50 MG PO TABS: 50.0000 mg | ORAL_TABLET | Freq: Every day | ORAL | Status: DC

## 2012-10-02 MED ORDER — VENLAFAXINE HCL 75 MG PO TABS: 75.0000 mg | ORAL_TABLET | Freq: Every day | ORAL | Status: DC

## 2012-10-05 ENCOUNTER — Encounter (HOSPITAL_COMMUNITY): Payer: Self-pay | Admitting: Physician Assistant
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
## 2012-10-09 ENCOUNTER — Other Ambulatory Visit (HOSPITAL_COMMUNITY): Payer: Self-pay | Admitting: Physician Assistant

## 2012-10-09 MED ORDER — LAMOTRIGINE 200 MG PO TABS: 400.0000 mg | ORAL_TABLET | Freq: Every day | ORAL | Status: DC

## 2012-11-19 ENCOUNTER — Other Ambulatory Visit (HOSPITAL_COMMUNITY): Payer: Self-pay | Admitting: Physician Assistant

## 2012-11-19 ENCOUNTER — Other Ambulatory Visit (HOSPITAL_COMMUNITY): Payer: Self-pay | Admitting: *Deleted

## 2012-11-19 DIAGNOSIS — F319 Bipolar disorder, unspecified: Secondary | ICD-10-CM

## 2012-11-19 MED ORDER — GABAPENTIN 300 MG PO CAPS: 300.0000 mg | ORAL_CAPSULE | Freq: Three times a day (TID) | ORAL | Status: DC

## 2012-12-07 ENCOUNTER — Ambulatory Visit (INDEPENDENT_AMBULATORY_CARE_PROVIDER_SITE_OTHER): Payer: 59 | Admitting: Physician Assistant

## 2012-12-07 VITALS — BP 131/86 | HR 69 | Ht 68.0 in | Wt 181.2 lb

## 2012-12-07 DIAGNOSIS — F1020 Alcohol dependence, uncomplicated: Secondary | ICD-10-CM

## 2012-12-07 DIAGNOSIS — F319 Bipolar disorder, unspecified: Secondary | ICD-10-CM

## 2012-12-07 DIAGNOSIS — F1310 Sedative, hypnotic or anxiolytic abuse, uncomplicated: Secondary | ICD-10-CM

## 2012-12-07 DIAGNOSIS — F1120 Opioid dependence, uncomplicated: Secondary | ICD-10-CM

## 2012-12-14 ENCOUNTER — Encounter (HOSPITAL_COMMUNITY): Payer: Self-pay | Admitting: Physician Assistant
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
## 2012-12-31 ENCOUNTER — Telehealth (HOSPITAL_COMMUNITY): Payer: Self-pay | Admitting: Physician Assistant

## 2012-12-31 ENCOUNTER — Telehealth (HOSPITAL_COMMUNITY): Payer: Self-pay

## 2012-12-31 NOTE — Telephone Encounter (Signed)
Made several attempts to returned patient's call concerning her reports that she is manic. The phone number and left on the message stated he could not be completed as dialed. Cell phone number no answer and no voicemail box set up. Home number female answered and reported she was at work. Work number reports it is no longer in service.

## 2013-02-03 ENCOUNTER — Other Ambulatory Visit (HOSPITAL_COMMUNITY): Payer: Self-pay | Admitting: Physician Assistant

## 2013-03-09 ENCOUNTER — Ambulatory Visit (HOSPITAL_COMMUNITY): Payer: Self-pay | Admitting: Physician Assistant

## 2013-03-10 ENCOUNTER — Other Ambulatory Visit (HOSPITAL_COMMUNITY): Payer: Self-pay | Admitting: Physician Assistant

## 2013-03-10 MED ORDER — RISPERIDONE 1 MG PO TABS: ORAL_TABLET | ORAL | Status: DC

## 2013-04-05 ENCOUNTER — Ambulatory Visit (HOSPITAL_COMMUNITY): Payer: Self-pay | Admitting: Psychiatry

## 2013-04-29 ENCOUNTER — Ambulatory Visit (INDEPENDENT_AMBULATORY_CARE_PROVIDER_SITE_OTHER): Payer: 59 | Admitting: Psychiatry

## 2013-04-29 ENCOUNTER — Encounter (HOSPITAL_COMMUNITY): Payer: Self-pay | Admitting: Psychiatry

## 2013-04-29 VITALS — BP 119/82 | HR 84 | Ht 69.0 in | Wt 183.6 lb

## 2013-04-29 DIAGNOSIS — Z9189 Other specified personal risk factors, not elsewhere classified: Secondary | ICD-10-CM

## 2013-04-29 DIAGNOSIS — F319 Bipolar disorder, unspecified: Secondary | ICD-10-CM

## 2013-04-29 MED ORDER — RISPERIDONE 2 MG PO TABS: ORAL_TABLET | ORAL | Status: DC

## 2013-04-29 MED ORDER — HYDROXYZINE HCL 25 MG PO TABS: ORAL_TABLET | ORAL | Status: DC

## 2013-04-29 MED ORDER — LAMOTRIGINE 200 MG PO TABS: 400.0000 mg | ORAL_TABLET | Freq: Every day | ORAL | Status: DC

## 2013-04-29 MED ORDER — GABAPENTIN 300 MG PO CAPS: ORAL_CAPSULE | ORAL | Status: DC

## 2013-04-29 MED ORDER — VENLAFAXINE HCL 75 MG PO TABS: ORAL_TABLET | ORAL | Status: DC
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
## 2013-07-28 ENCOUNTER — Ambulatory Visit (HOSPITAL_COMMUNITY): Payer: Self-pay | Admitting: Psychiatry

## 2013-08-04 ENCOUNTER — Other Ambulatory Visit (HOSPITAL_COMMUNITY): Payer: Self-pay | Admitting: Psychiatry

## 2013-08-04 ENCOUNTER — Other Ambulatory Visit (HOSPITAL_COMMUNITY): Payer: Self-pay | Admitting: Physician Assistant

## 2013-08-04 DIAGNOSIS — F3130 Bipolar disorder, current episode depressed, mild or moderate severity, unspecified: Secondary | ICD-10-CM

## 2013-08-04 NOTE — Telephone Encounter (Signed)
Chart reviewed. Refills appropriate, pt has appointment in April, did not have enough to last until appointment due to being rescheduled several times by office staff.

## 2013-08-04 NOTE — Telephone Encounter (Signed)
Refill not appropriate, refill was given for vistaril 25mg  prn as ordered by Dr. Lolly MustacheArfeen.

## 2013-08-10 ENCOUNTER — Ambulatory Visit (HOSPITAL_COMMUNITY): Payer: Self-pay | Admitting: Psychiatry

## 2013-09-02 ENCOUNTER — Ambulatory Visit (HOSPITAL_COMMUNITY): Payer: Self-pay | Admitting: Psychiatry

## 2013-09-08 ENCOUNTER — Ambulatory Visit (HOSPITAL_COMMUNITY): Payer: Self-pay | Admitting: Psychiatry

## 2013-09-28 ENCOUNTER — Ambulatory Visit (HOSPITAL_COMMUNITY): Payer: Self-pay | Admitting: Psychiatry

## 2013-09-28 ENCOUNTER — Ambulatory Visit (INDEPENDENT_AMBULATORY_CARE_PROVIDER_SITE_OTHER): Payer: 59 | Admitting: Psychiatry

## 2013-09-28 ENCOUNTER — Encounter (HOSPITAL_COMMUNITY): Payer: Self-pay | Admitting: Psychiatry

## 2013-09-28 VITALS — BP 126/67 | HR 71 | Ht 69.0 in | Wt 177.4 lb

## 2013-09-28 DIAGNOSIS — F319 Bipolar disorder, unspecified: Secondary | ICD-10-CM

## 2013-09-28 DIAGNOSIS — F3130 Bipolar disorder, current episode depressed, mild or moderate severity, unspecified: Secondary | ICD-10-CM

## 2013-09-28 MED ORDER — LAMOTRIGINE 200 MG PO TABS: ORAL_TABLET | ORAL | Status: DC

## 2013-09-28 MED ORDER — VENLAFAXINE HCL 75 MG PO TABS: ORAL_TABLET | ORAL | Status: DC

## 2013-09-28 MED ORDER — RISPERIDONE 2 MG PO TABS: ORAL_TABLET | ORAL | Status: DC

## 2013-09-28 MED ORDER — HYDROXYZINE HCL 25 MG PO TABS: ORAL_TABLET | ORAL | Status: DC

## 2013-09-28 NOTE — Progress Notes (Signed)
[facility] [id number] Progress Note  Jeanette ButcherSarah J Bell [id number] 43 y.o.  12/07/2012 3:54 PM  Chief Complaint:  Mmedication management and followup.    History of Present Illness:  Jeanette SagoSarah came for her followup appointment.  She is complaining of a psychotropic medication she continues to have some mood lability agitation and irritability but overall her depression and manic symptoms are improved from the past.  She reported her job is going very well.  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  Recently she got promotion .  She is taking multiple medications for her psychiatric illness.  She is taking Vistaril, Risperdal, Effexor and Lamictal.  She is to have chronic pain she is taking Neurontin for that.  She denies any drinking in recent months.  She reported good relationship with her husband who she's been married for more than 20 years.  She has 3 children.  She denies any tremors or shakes.  She denies any paranoia or any sedation.  She was to continue her current psychotropic medication.  She reported changes in her appetite vitals are stable.   Suicidal Ideation: No Plan Formed: No Patient has means to carry out plan: No  Homicidal Ideation: No Plan Formed: No Patient has means to carry out plan: No  Review of Systems: Psychiatric: Agitation: No Hallucination: No Depressed Mood: No Insomnia: Yes Hypersomnia: No Altered Concentration: No Feels Worthless: No Grandiose Ideas: No Belief In Special Powers: No New/Increased Substance Abuse: Yes Compulsions: No  Neurologic: Headache: No Seizure: No Paresthesias: No  Past Medical History:  Past Medical History   Diagnosis  Date   .  Chronic hip pain      left hip. treated with radiofrequency   .  Depression    .  Bipolar 1 disorder, depressed  03/18/2012    Family History:  Family History   Problem  Relation  Age of Onset   .  Depression  Mother    .  Alcohol abuse  Father    .  Bipolar disorder  Sister     .  Depression  Maternal Aunt    .  Depression  Maternal Grandmother       Outpatient Encounter Prescriptions as of 09/28/2013  Medication Sig  . acetaminophen (TYLENOL) 500 MG tablet Take 1,000 mg by mouth every 6 (six) hours as needed. For pain  . gabapentin (NEURONTIN) 300 MG capsule Take 1 capsule by mouth 3  times daily  . hydrOXYzine (ATARAX/VISTARIL) 25 MG tablet Take 1 tablet by mouth as  needed for anxiety (maximum 2 a day)  . lamoTRIgine (LAMICTAL) 200 MG tablet Take 2 tablets by mouth  daily.  . risperiDONE (RISPERDAL) 2 MG tablet Take 1 tablet by mouth at  bedtime  . tiZANidine (ZANAFLEX) 4 MG tablet   . venlafaxine (EFFEXOR) 75 MG tablet Take 1 tablet by mouth   daily for depression and   anxiety  . Vitamin D, Ergocalciferol, (DRISDOL) 50000 UNITS CAPS capsule   . [DISCONTINUED] baclofen (LIORESAL) 20 MG tablet   . [DISCONTINUED] hydrOXYzine (ATARAX/VISTARIL) 25 MG tablet Take 1 tablet by mouth as  needed for anxiety (maximum 2 a day)  . [DISCONTINUED] lamoTRIgine (LAMICTAL) 200 MG tablet Take 2 tablets by mouth  daily.  . [DISCONTINUED] risperiDONE (RISPERDAL) 2 MG tablet Take 1 tablet by mouth at  bedtime  . [DISCONTINUED] venlafaxine (EFFEXOR) 75 MG tablet Take 1 tablet by mouth   daily for depression and   anxiety    Past  Psychiatric History/Hospitalization(s): Patient has one psychiatric hospitalization in 2013 after taking overdose on her Xanax, oxycodone and alcohol.  She has history of bipolar disorder.  In the past [facility], Dr Evelene CroonKaur.  Anxiety: Yes Bipolar Disorder: Yes Depression: Yes Mania: Yes Psychosis: No Schizophrenia: No Personality Disorder: No Hospitalization for psychiatric illness: Yes History of Electroconvulsive Shock Therapy: No Prior Suicide Attempts: No  Physical Exam: Constitutional:  BP 126/67  Pulse 71  Ht 5\' 9"  (1.753 m)  Wt 177 lb 6.4 oz (80.468 kg)  BMI 26.19 kg/m2  General  Appearance: alert, oriented, no acute distress, well nourished and well groomed and casually dressed  Musculoskeletal: Strength & Muscle Tone: within normal limits Gait & Station: normal Patient leans: N/A  Psychiatric: Speech (describe rate, volume, coherence, spontaneity, and abnormalities if any): Clear and coherent had a regular rate and rhythm and normal volume  Thought Process (describe rate, content, abstract reasoning, and computation): Within normal limits  Associations: Intact  Thoughts: normal  Mental Status: Orientation: oriented to person, place, time/date and situation Mood & Affect: Mildly anxious with a congruent affect Attention Span & Concentration: Intact  Established Problem, Stable/Improving (1), Review of Last Therapy Session (1) and Review of Medication Regimen & Side Effects (2)  Assessment: Axis I: Bipolar disorder not otherwise specified, history of opiate dependence, history of alcohol abuse  Axis II: Deferred  Axis III: Chronic left hip pain, lumbar spine pain  Axis IV: Moderate  Axis V: 60   Plan:  Patient is a stable on her current psychotropic medication.  I will continue Risperdal 2 mg  at bedtime, Effexor 75 mg daily, Vistaril 25 mg twice a day as needed and Lamictal 200 mg 2 tablets daily.  I recommended to see her primary care physician for Neurontin since she is taking for chronic pain.  Discussed in detail her chronic drinking which has been less intense from the past.  Discussed medication side effects and interaction with alcohol.  Recommended to call us back if she has any question or any concern.  Followup in 3 months.  Katyana Trolinger T., MD 09/28/2013

## 2013-10-25 ENCOUNTER — Other Ambulatory Visit (HOSPITAL_COMMUNITY): Payer: Self-pay | Admitting: Psychiatry

## 2013-10-26 ENCOUNTER — Other Ambulatory Visit (HOSPITAL_COMMUNITY): Payer: Self-pay

## 2013-12-29 ENCOUNTER — Ambulatory Visit (HOSPITAL_COMMUNITY): Payer: Self-pay | Admitting: Psychiatry

## 2014-02-01 ENCOUNTER — Ambulatory Visit (HOSPITAL_COMMUNITY): Payer: Self-pay | Admitting: Psychiatry

## 2014-02-14 ENCOUNTER — Ambulatory Visit (HOSPITAL_COMMUNITY): Payer: Self-pay | Admitting: Psychiatry

## 2014-02-25 ENCOUNTER — Ambulatory Visit (INDEPENDENT_AMBULATORY_CARE_PROVIDER_SITE_OTHER): Payer: 59 | Admitting: Psychiatry

## 2014-02-25 ENCOUNTER — Ambulatory Visit (HOSPITAL_COMMUNITY): Payer: Self-pay | Admitting: Psychiatry

## 2014-02-25 ENCOUNTER — Encounter (HOSPITAL_COMMUNITY): Payer: Self-pay | Admitting: Psychiatry

## 2014-02-25 VITALS — BP 130/87 | HR 78 | Wt 160.4 lb

## 2014-02-25 DIAGNOSIS — F1120 Opioid dependence, uncomplicated: Secondary | ICD-10-CM

## 2014-02-25 DIAGNOSIS — F1010 Alcohol abuse, uncomplicated: Secondary | ICD-10-CM

## 2014-02-25 DIAGNOSIS — F319 Bipolar disorder, unspecified: Secondary | ICD-10-CM

## 2014-02-25 MED ORDER — HYDROXYZINE HCL 25 MG PO TABS: 25.0000 mg | ORAL_TABLET | ORAL | Status: DC | PRN

## 2014-02-25 MED ORDER — GABAPENTIN 300 MG PO CAPS: 300.0000 mg | ORAL_CAPSULE | Freq: Three times a day (TID) | ORAL | Status: DC

## 2014-02-25 MED ORDER — VENLAFAXINE HCL 75 MG PO TABS: 75.0000 mg | ORAL_TABLET | Freq: Every day | ORAL | Status: DC

## 2014-02-25 MED ORDER — LAMOTRIGINE 200 MG PO TABS: 200.0000 mg | ORAL_TABLET | Freq: Two times a day (BID) | ORAL | Status: DC

## 2014-02-25 MED ORDER — RISPERIDONE 2 MG PO TABS: 2.0000 mg | ORAL_TABLET | Freq: Every day | ORAL | Status: DC

## 2014-02-25 NOTE — Progress Notes (Addendum)
[facility] [id number] Progress Note  Jeanette Bell [id number] 43 y.o.   Chief Complaint:  Mmedication management and followup.    History of Present Illness:  Jeanette Bell came for her followup appointment.  She is taking her medication and denies any side effects.  She continued to endorse some time manic-like symptoms however she does not want to change or try a different medication.  She continues to work at [employer] when she enjoys her work.  Her sleep is okay.  She denies any irritability or any anger.  She was to continue her current medication.  Her appetite is okay.  Her vitals are stable.  She is going to [employer] meeting on a regular basis.  She does not drink or use any illegal substances. She reported good relationship with her husband who she's been married for more than 20 years.  She has 3 children.    Suicidal Ideation: No Plan Formed: No Patient has means to carry out plan: No  Homicidal Ideation: No Plan Formed: No Patient has means to carry out plan: No  Review of Systems: Psychiatric: Agitation: No Hallucination: No Depressed Mood: No Insomnia: Yes Hypersomnia: No Altered Concentration: No Feels Worthless: No Grandiose Ideas: No Belief In Special Powers: No New/Increased Substance Abuse: Yes Compulsions: No  Neurologic: Headache: No Seizure: No Paresthesias: No  Past Medical History:  Patient has chronic pain.  Outpatient Encounter Prescriptions as of 02/25/2014  Medication Sig  . acetaminophen (TYLENOL) 500 MG tablet Take 1,000 mg by mouth every 6 (six) hours as needed. For pain  . gabapentin (NEURONTIN) 300 MG capsule Take 1 capsule (300 mg total) by mouth 3 (three) times daily.  . hydrOXYzine (ATARAX/VISTARIL) 25 MG tablet Take 1 tablet (25 mg total) by mouth as needed.  . lamoTRIgine (LAMICTAL) 200 MG tablet Take 1 tablet (200 mg total) by mouth 2 (two) times daily.  . risperiDONE (RISPERDAL) 2 MG tablet Take 1 tablet (2 mg total) by mouth at bedtime.   Marland Kitchen. tiZANidine (ZANAFLEX) 4 MG tablet   . venlafaxine (EFFEXOR) 75 MG tablet Take 1 tablet (75 mg total) by mouth daily.  . Vitamin D, Ergocalciferol, (DRISDOL) 50000 UNITS CAPS capsule   . [DISCONTINUED] gabapentin (NEURONTIN) 300 MG capsule Take 1 capsule by mouth 3  times daily  . [DISCONTINUED] hydrOXYzine (ATARAX/VISTARIL) 25 MG tablet Take 1 tablet by mouth as   needed for anxiety (maximum 2 tablets a day)  . [DISCONTINUED] lamoTRIgine (LAMICTAL) 200 MG tablet Take 2 tablets by mouth  daily  . [DISCONTINUED] risperiDONE (RISPERDAL) 2 MG tablet Take 1 tablet by mouth at  bedtime  . [DISCONTINUED] venlafaxine (EFFEXOR) 75 MG tablet Take 1 tablet by mouth    daily for depression and    anxiety    Past Psychiatric History/Hospitalization(s): Patient has one psychiatric hospitalization in 2013 after taking overdose on her Xanax, oxycodone and alcohol.  She has history of bipolar disorder.  In the past [facility], Dr Evelene CroonKaur.  Anxiety: Yes Bipolar Disorder: Yes Depression: Yes Mania: Yes Psychosis: No Schizophrenia: No Personality Disorder: No Hospitalization for psychiatric illness: Yes History of Electroconvulsive Shock Therapy: No Prior Suicide Attempts: No  Physical Exam: Constitutional:  BP 130/87  Pulse 78  Wt 160 lb 6.4 oz (72.757 kg)  General Appearance: alert, oriented, no acute distress, well nourished and well groomed and casually dressed  Musculoskeletal: Strength & Muscle Tone: within normal limits Gait & Station: normal Patient leans: N/A  Psychiatric:  Speech (describe rate, volume, coherence, spontaneity, and abnormalities if any): Clear and coherent had a regular rate and rhythm and normal volume  Thought Process (describe rate, content, abstract reasoning, and computation): Within normal limits  Associations: Intact  Thoughts: normal  Mental Status: Orientation: oriented to person, place, time/date and  situation Mood & Affect: Mildly anxious with a congruent affect Attention Span & Concentration: Intact  Established Problem, Stable/Improving (1), Review of Last Therapy Session (1) and Review of Medication Regimen & Side Effects (2)  Assessment: Axis I: Bipolar disorder not otherwise specified, history of opiate dependence, history of alcohol abuse  Axis II: Deferred  Axis III: Chronic left hip pain, lumbar spine pain  Axis IV: Moderate  Axis V: 60   Plan:  Patient is a stable on her current psychotropic medication.  I will continue Risperdal 2 mg  at bedtime, Effexor 75 mg daily, Vistaril 25 mg twice a day as needed and Lamictal 200 mg 2 tablets daily.  She does not have any rash or itching.  I will order CBC, CMP, hemoglobin A1c and TSH.  Followup in 3 months.  Discussed medication side effects and interaction with alcohol.  Recommended to call us back if she has any question or any concern.    ARFEEN,SYED T., MD 02/25/2014

## 2014-03-03 ENCOUNTER — Other Ambulatory Visit (HOSPITAL_COMMUNITY): Payer: Self-pay | Admitting: *Deleted

## 2014-03-03 DIAGNOSIS — F319 Bipolar disorder, unspecified: Secondary | ICD-10-CM

## 2014-03-03 MED ORDER — HYDROXYZINE HCL 25 MG PO TABS: 25.0000 mg | ORAL_TABLET | ORAL | Status: DC | PRN

## 2014-04-11 ENCOUNTER — Other Ambulatory Visit (HOSPITAL_COMMUNITY): Payer: Self-pay | Admitting: Psychiatry

## 2014-04-12 ENCOUNTER — Other Ambulatory Visit (HOSPITAL_COMMUNITY): Payer: Self-pay | Admitting: Psychiatry

## 2014-04-12 NOTE — Telephone Encounter (Signed)
She was given 90 days supply on 02/25/14. Too soon to refill. She must have blood work before her next refill.

## 2014-05-30 ENCOUNTER — Encounter (HOSPITAL_COMMUNITY): Payer: Self-pay | Admitting: Psychiatry

## 2014-05-30 ENCOUNTER — Ambulatory Visit (INDEPENDENT_AMBULATORY_CARE_PROVIDER_SITE_OTHER): Payer: 59 | Admitting: Psychiatry

## 2014-05-30 VITALS — BP 130/73 | HR 71 | Ht 69.0 in | Wt 161.6 lb

## 2014-05-30 DIAGNOSIS — F1121 Opioid dependence, in remission: Secondary | ICD-10-CM

## 2014-05-30 DIAGNOSIS — F1021 Alcohol dependence, in remission: Secondary | ICD-10-CM

## 2014-05-30 DIAGNOSIS — F319 Bipolar disorder, unspecified: Secondary | ICD-10-CM

## 2014-05-30 MED ORDER — VENLAFAXINE HCL 75 MG PO TABS: 75.0000 mg | ORAL_TABLET | Freq: Every day | ORAL | Status: DC

## 2014-05-30 MED ORDER — LAMOTRIGINE 200 MG PO TABS: 200.0000 mg | ORAL_TABLET | Freq: Two times a day (BID) | ORAL | Status: DC

## 2014-05-30 MED ORDER — GABAPENTIN 300 MG PO CAPS: 300.0000 mg | ORAL_CAPSULE | Freq: Three times a day (TID) | ORAL | Status: DC

## 2014-05-30 MED ORDER — RISPERIDONE 2 MG PO TABS: 2.0000 mg | ORAL_TABLET | Freq: Every day | ORAL | Status: DC

## 2014-05-30 NOTE — Progress Notes (Addendum)
[facility] [address] Progress Note  Jeanette Bell [id number] 44 y.o.  Chief Complaint:  Mmedication management and followup.    History of Present Illness:  [name] came for her followup appointment.  She had a quiet Christmas.  She usually does not like holidays and try to keep herself busy.  She was working on the holidays.  She likes her job.  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  She will get [employer]. She denies any mania or any irritability.  She is compliant with Lamictal , trazodone, Risperdal and Neurontin.  There are times that she takes Vistaril when she is very nervous and anxious.  Patient denies any rash or itching.  She sleeping good.  She continues to attend AA meeting when she has time .  She denies drinking or using any illegal substances.  Patient lives with her husband who is very supportive.  Patient has 3 children.  Her appetite is okay.  Her vitals are stable.  Patient has blood work however we do not have results available.  Patient promised that she will fax the blood results tomorrow.  She told her blood work results are normal.  Suicidal Ideation: No Plan Formed: No Patient has means to carry out plan: No  Homicidal Ideation: No Plan Formed: No Patient has means to carry out plan: No  Review of Systems: Psychiatric: Agitation: No Hallucination: No Depressed Mood: No Insomnia: No Hypersomnia: No Altered Concentration: No Feels Worthless: No Grandiose Ideas: No Belief In Special Powers: No New/Increased Substance Abuse: No Compulsions: No  Neurologic: Headache: No Seizure: No Paresthesias: No  Past Medical History:  Patient has chronic pain.  Outpatient Encounter Prescriptions as of 05/30/2014  Medication Sig  . acetaminophen (TYLENOL) 500 MG tablet Take 1,000 mg by mouth every 6 (six) hours as needed. For pain  . gabapentin (NEURONTIN) 300 MG capsule Take 1 capsule (300 mg total) by mouth 3  (three) times daily.  . hydrOXYzine (ATARAX/VISTARIL) 25 MG tablet Take 1 tablet (25 mg total) by mouth as needed.  . lamoTRIgine (LAMICTAL) 200 MG tablet Take 1 tablet (200 mg total) by mouth 2 (two) times daily.  . risperiDONE (RISPERDAL) 2 MG tablet Take 1 tablet (2 mg total) by mouth at bedtime.  Marland Kitchen tiZANidine (ZANAFLEX) 4 MG tablet   . venlafaxine (EFFEXOR) 75 MG tablet Take 1 tablet (75 mg total) by mouth daily.  . Vitamin D, Ergocalciferol, (DRISDOL) 50000 UNITS CAPS capsule   . [DISCONTINUED] gabapentin (NEURONTIN) 300 MG capsule Take 1 capsule (300 mg total) by mouth 3 (three) times daily.  . [DISCONTINUED] lamoTRIgine (LAMICTAL) 200 MG tablet Take 1 tablet (200 mg total) by mouth 2 (two) times daily.  . [DISCONTINUED] risperiDONE (RISPERDAL) 2 MG tablet Take 1 tablet (2 mg total) by mouth at bedtime.  . [DISCONTINUED] venlafaxine (EFFEXOR) 75 MG tablet Take 1 tablet (75 mg total) by mouth daily.    Past Psychiatric History/Hospitalization(s): Patient has one psychiatric hospitalization in 2013 after taking overdose on her Xanax, oxycodone and alcohol.  She has history of bipolar disorder.  In the past she was seen psychiatrist at [facility] health, Dr Evelene Croon.  Anxiety: Yes Bipolar Disorder: Yes Depression: Yes Mania: Yes Psychosis: No Schizophrenia: No Personality Disorder: No Hospitalization for psychiatric illness: Yes History of Electroconvulsive Shock Therapy: No Prior Suicide Attempts: No  Physical Exam: Constitutional:  BP 130/73 mmHg  Pulse 71  Ht  (1.753 m)  Wt 161 lb 9.6 oz (73.301 kg)  BMI 23.85 kg/m2  General Appearance: alert, oriented, no acute distress, well nourished and well groomed and casually dressed  Musculoskeletal: Strength & Muscle Tone: within normal limits Gait & Station: normal Patient leans: N/A  Psychiatric: Speech (describe rate, volume, coherence, spontaneity, and abnormalities if any): Clear and coherent had a regular  rate and rhythm and normal volume  Thought Process (describe rate, content, abstract reasoning, and computation): Within normal limits  Associations: Intact  Thoughts: normal  Mental Status: Orientation: oriented to person, place, time/date and situation Mood & Affect: normal affect Attention Span & Concentration: Intact  Established Problem, Stable/Improving (1), Review of Last Therapy Session (1) and Review of Medication Regimen & Side Effects (2)  Assessment: Axis I: Bipolar disorder not otherwise specified, history of opiate dependence, history of alcohol abuse  Axis II: Deferred  Axis III: Chronic left hip pain, lumbar spine pain  Plan:  Patient is a stable on her current psychotropic medication.  She does not have any side effects including any rash, itching or any tremors.  I will continue Risperdal 2 mg  at bedtime, Effexor 75 mg daily, Lamictal 200 mg twice a day.  She takes Vistaril 25 mg as needed and she still has refill remaining.  I reminded her to have her blood work results faxed to us .  Discussed medication side effects.  Recommended to call us back if she has any question, concern or if you from worsening of the symptoms.  I will see her again in 3 months.  Klayton Monie T., MD 05/30/2014

## 2014-08-29 ENCOUNTER — Encounter (HOSPITAL_COMMUNITY): Payer: Self-pay | Admitting: Psychiatry

## 2014-08-29 ENCOUNTER — Ambulatory Visit (INDEPENDENT_AMBULATORY_CARE_PROVIDER_SITE_OTHER): Payer: 59 | Admitting: Psychiatry

## 2014-08-29 VITALS — BP 126/79 | HR 69 | Ht 69.0 in | Wt 162.6 lb

## 2014-08-29 DIAGNOSIS — Z87898 Personal history of other specified conditions: Secondary | ICD-10-CM | POA: Diagnosis not present

## 2014-08-29 DIAGNOSIS — F319 Bipolar disorder, unspecified: Secondary | ICD-10-CM | POA: Diagnosis not present

## 2014-08-29 MED ORDER — LAMOTRIGINE 200 MG PO TABS: 200.0000 mg | ORAL_TABLET | Freq: Two times a day (BID) | ORAL | Status: DC

## 2014-08-29 MED ORDER — GABAPENTIN 300 MG PO CAPS: 300.0000 mg | ORAL_CAPSULE | Freq: Three times a day (TID) | ORAL | Status: DC

## 2014-08-29 MED ORDER — RISPERIDONE 1 MG PO TABS: 1.0000 mg | ORAL_TABLET | Freq: Every day | ORAL | Status: DC

## 2014-08-29 MED ORDER — VENLAFAXINE HCL 75 MG PO TABS: 75.0000 mg | ORAL_TABLET | Freq: Every day | ORAL | Status: DC

## 2014-08-29 NOTE — Progress Notes (Addendum)
[facility] [address] Progress Note  Jeanette Bell [id number] 44 y.o.  Chief Complaint:  I'm not taking Risperdal every day because it is making me sleepy.      History of Present Illness:  Jeanette Bell came for her followup appointment.  She admitted not taking Risperdal every day because it is making her sleepy and groggy The.  She usually takes on the weekend and admitted the days when she does not take the Risperdal she is somewhat hyper and distracted.  However she denies any mania or any psychosis.  She is happy that her twin son are doing very well.  One of her son is involved in wrestling and she usually goes out of the town to attend his performance.  She is compliant with Lamictal, trazodone, Neurontin.  She denies any major panic attack.  She denies any drinking or using any illegal substances.  She has been not going to AA meeting because her schedule his been very busy.  She is working as a Lexicographer. .  She enjoys her job.  She lives with her husband and 3 children.  Her husband is very supportive.  She had a blood work which was done in October and her blood work was normal.    Suicidal Ideation: No Plan Formed: No Patient has means to carry out plan: No  Homicidal Ideation: No Plan Formed: No Patient has means to carry out plan: No  Review of Systems  Skin: Negative for itching and rash.  Neurological: Negative for tremors.  Psychiatric/Behavioral: Negative for suicidal ideas, hallucinations and substance abuse. The patient is nervous/anxious and has insomnia.      Psychiatric: Agitation: No Hallucination: No Depressed Mood: No Insomnia: No Hypersomnia: No Altered Concentration: No Feels Worthless: No Grandiose Ideas: No Belief In Special Powers: No New/Increased Substance Abuse: No Compulsions: No  Neurologic: Headache: No Seizure: No Paresthesias: No  Past Medical History:  Patient has chronic pain.  Outpatient Encounter Prescriptions as of  08/29/2014  Medication Sig  . acetaminophen (TYLENOL) 500 MG tablet Take 1,000 mg by mouth every 6 (six) hours as needed. For pain  . gabapentin (NEURONTIN) 300 MG capsule Take 1 capsule (300 mg total) by mouth 3 (three) times daily.  Marland Kitchen lamoTRIgine (LAMICTAL) 200 MG tablet Take 1 tablet (200 mg total) by mouth 2 (two) times daily.  . risperiDONE (RISPERDAL) 1 MG tablet Take 1 tablet (1 mg total) by mouth at bedtime.  Marland Kitchen tiZANidine (ZANAFLEX) 2 MG tablet   . tiZANidine (ZANAFLEX) 4 MG tablet   . venlafaxine (EFFEXOR) 75 MG tablet Take 1 tablet (75 mg total) by mouth daily.  . Vitamin D, Ergocalciferol, (DRISDOL) 50000 UNITS CAPS capsule   . [DISCONTINUED] gabapentin (NEURONTIN) 300 MG capsule Take 1 capsule (300 mg total) by mouth 3 (three) times daily.  . [DISCONTINUED] hydrOXYzine (ATARAX/VISTARIL) 25 MG tablet Take 1 tablet (25 mg total) by mouth as needed.  . [DISCONTINUED] lamoTRIgine (LAMICTAL) 200 MG tablet Take 1 tablet (200 mg total) by mouth 2 (two) times daily.  . [DISCONTINUED] risperiDONE (RISPERDAL) 2 MG tablet Take 1 tablet (2 mg total) by mouth at bedtime.  . [DISCONTINUED] venlafaxine (EFFEXOR) 75 MG tablet Take 1 tablet (75 mg total) by mouth daily.    Past Psychiatric History/Hospitalization(s): Patient has one psychiatric hospitalization in 2013 after taking overdose on her Xanax, oxycodone and alcohol.  She has history of bipolar disorder.  In the past she was seen psychiatrist at [facility],  Dr Evelene CroonKaur.  Anxiety: Yes Bipolar Disorder: Yes Depression: Yes Mania: Yes Psychosis: No Schizophrenia: No Personality Disorder: No Hospitalization for psychiatric illness: Yes History of Electroconvulsive Shock Therapy: No Prior Suicide Attempts: No  Physical Exam: Constitutional:  BP 126/79 mmHg  Pulse 69  Ht 5\' 9"  (1.753 m)  Wt 162 lb 9.6 oz (73.755 kg)  BMI 24.00 kg/m2  General Appearance: alert, oriented, no acute distress, well nourished and well  groomed and casually dressed  Musculoskeletal: Strength & Muscle Tone: within normal limits Gait & Station: normal Patient leans: N/A  Mental status examination: Patient is casually dressed and groomed.  Her speech is fast, clear and coherent.  Her thought process logical and goal-directed.  Her attention and concentration is distracted at times.  She denies any auditory or visual hallucination.  She denies any active or passive suicidal thoughts or homicidal thought.  There were no paranoia, delusion or any obsessive thoughts.  Her fund of knowledge is adequate.  Her psychomotor activity is slightly increased.  Her cognition is intact.  She's alert and oriented 3.  Her insight judgment and impulse control is okay.  Established Problem, Stable/Improving (1), Review of Psycho-Social Stressors (1), Review or order clinical lab tests (1), Review and summation of old records (2), Review of Last Therapy Session (1), Review of Medication Regimen & Side Effects (2) and Review of New Medication or Change in Dosage (2)  Assessment: Axis I: Bipolar disorder not otherwise specified, history of opiate dependence, history of alcohol abuse  Axis II: Deferred  Axis III: Chronic left hip pain, lumbar spine pain  Plan:  I review her blood work results, current medication and psychosocial stressors.  I recommended to try Risperdal 1 mg since she is unable to tolerate 2 mg at bedtime.  We talked about risk of noncompliance with medication.  She agreed with the plan.  I will reduce Risperdal 1 mg at bedtime and continue Lamictal 200 mg twice a day, Effexor 75 mg daily .  Patient is no longer taking Vistaril.  Continue Neurontin 300 mg 3 times a day.  Discussed medication side effects and benefits.  Follow-up in 3 months.  Time spent 25 minutes.  More than 50% of the time spent in psychoeducation, counseling and coordination of care.  Discuss safety plan that anytime having active suicidal thoughts or homicidal  thoughts then patient need to call 911 or go to the local emergency room.   Jeanette Bell T., MD 08/29/2014

## 2014-09-06 NOTE — Consult Note (Signed)
PATIENT NAME:  Jeanette Bell, Jeanette Bell MR#:  [id number] DATE OF BIRTH:  16-Jan-1971  DATE OF CONSULTATION:  03/07/2012  REFERRING PHYSICIAN:  Janalyn Harderavid Kaminski, MD of the Emergency Department   CONSULTING PHYSICIAN:  Rose PhiPeter R. 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 Cassell ClementLynde Knowles-Jonas with history of bipolar disorder and history of chronic low back pain for 16 years status post 02/28/2012 radiofrequency treatment with benefit. She presented to the Emergency Room this morning and was referred for evaluation of problems with tremor. History comes from the patient, her mother, Dr. Carollee MassedKaminski, and from her hospital chart.   The patient came to the Emergency Room at 9:10 a.m. 03/07/2012 for problems with tremor and unsteady gait beginning at approximately 5:30 a.m. while she was making breakfast for her husband and two 42 year old sons before they left at 6:00 a.m. to travel for a wrestling meet. She had not had any problems on first getting out of bed at 5 a.m. After cleaning up the kitchen after  making breakfast, She lay down for a couple of hours, but she still was unsteady on her feet and trembling all over. She and her 42 year old daughter were to go to take care of a flat tire, but her daughter insisted that the patient come to the Emergency Room.   On arrival to the Emergency Room, blood pressure was 150/71. Brain CT scan was benign. Blood studies were normal with the exception of magnesium mildly decreased at 1.6 and calcium mildly decreased at 8.3. She was given magnesium intravenously.   The patient reports no prior problems with tremor. She reports her bipolar  symptoms had been without significant change since [facility]. She has had no recent change in medications.   Asked about recent stressors, the patient and her mother described her as being significantly stressed associated  with problems on 03/06/2012. While at work, she was accused of being on drugs by a patient when she  drew her blood. The patient was taken by a supervisor at 4:45 p.m. to [facility] to give a urine sample for urine drug screen. The patient reports that when she drove back to work after lunch break, she discovered she had a flat tire when parking the car. She endorses some strain in her relationship with her husband.   PAST MEDICAL HISTORY: Surgeries include adenoidectomy at age 44 or [age], right knee arthroscopy at age 44 following injury playing softball, placement of bladder stimulator three years ago by Dr., Barnabas ListerWashington for continued history of urinary incontinence, cesarean section delivery of twin sons 16 years ago, nerve blocks for her low back approximately eight weeks prior to admission, radiofrequency procedure for low back 02/28/2012 referred to above.   She reports diagnosis of bipolar disorder 10 years ago with history of problems beginning at about age 44. She had low back pain problems beginning about 16 years ago. She has history of migraine headaches beginning at age 44, occurring 3 to 4 times a month. She has mild headache a couple of times a week. She has not had seizure, meningitis, stroke or temporary stroke. She denies history of hypertension, diabetes, known heart disease, peptic ulcer disease, gastroesophageal reflux. She denies history of thyroid problems, reports most recent check of thyroid status by Dr. Toya SmothersKnowles-Jonas showed her thyroid to be slightly low.   HABITS: She discontinued 1/2 pack per day smoking one month prior  to admission. Alcohol use averages two beers about twice a week. She uses no caffeine.   ALLERGIES: None.   MEDICATIONS: Regular medications include clonazepam 1 mg taking 1/2 two times a day and one full tablet at night, Lamictal 200 mg twice a day, Zanaflex 2 mg three times a day, Tramadol 100 mg twice a day, Norco every six hours, and  venlafaxine 75 mg taking two a day. She takes Tylenol as needed for headaches.    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 She lives with her husband of 19 years, has 53 year old identical twin sons and a 55 year old daughter. She has one sibling, a 58 year old sister who has hypertension, bipolar disorder and migraine headaches. The patient's 60 year old father is on medication for hypertension, gout, his thyroid. Her 73 year old mother takes medications for hypertension, anemia, hypothyroidism, depression. There is history of bipolar problems in the patient's mother's mother and one of the patient's mother's two sisters who is 56 and has migraine headaches. The patient's mother's other sister died at age 106 of pneumonia and had reported complication of lupus.   PHYSICAL EXAMINATION: The patient is a well-developed, well-nourished white woman who was found to be in no acute distress, appearing dysphoric but without tearfulness, most notable for continual diffuse tremulousness of upper extremities as well as lower extremities, markedly decreased with distraction of tasks during neurologic examination. She was normocephalic without evidence of trauma. Her  neck was supple with good range of motion in all directions. Mental status was normal aside from  dysphoric affect, though cognitive testing was not performed in detail. She was alert and oriented with clear speech and normal expression and was lucid and a good historian. Cranial nerve examination was normal including facial sensation, facial appearance at rest and with conversation, eye movements, visual fields to finger count for each eye, evaluation of blind spots. Hearing acuity was normal to finger rubs bilaterally. Examination of the oropharynx showed normal midline uvula elevation with phonation and normal  appearance of tongue with normal tongue movements.   Motor examination of the extremities showed normal tone and bulk throughout and full power throughout. Extremity sensation was normal including vibration perception of the great toes. Reflexes were diffusely increased, rated 3+ throughout. Gait exam was limited; she was very cautious and took short steps. Coordination examination was symmetric and notable for rest and action tremors as noted above. There was intention tremor bilaterally with finger-to-nose movements.   IMPRESSION: Her clinical picture appears most consistent with development of generalized tremor the morning of 03/07/2012 secondary to stress reaction.   RECOMMENDATIONS:  1. Valium 5 mg four times a day today and tomorrow.  2. Continue regular medications.  3. The patient is encouraged to re-establish contact with [facility].  4. I do not see any indication for lumbar puncture.   I appreciate being asked to see this pleasant and interesting lady.  ____________________________ Rose Phi. Kemper Durie, MD prc:cbb D: 03/07/2012 22:26:35 ET T: 03/08/2012 12:23:40 ET JOB#: [id number]  cc: Rose Phi. Kemper Durie, MD, <Dictator> Gaspar Garbe MD ELECTRONICALLY SIGNED 04/07/2012 13:12

## 2014-09-09 ENCOUNTER — Telehealth (HOSPITAL_COMMUNITY): Payer: Self-pay | Admitting: *Deleted

## 2014-09-09 NOTE — Telephone Encounter (Signed)
Patient needs to r/s appointment made on 11-29-2014. LMOM for patient to call back.

## 2014-09-11 ENCOUNTER — Encounter (HOSPITAL_COMMUNITY): Payer: Self-pay

## 2014-09-15 ENCOUNTER — Telehealth (HOSPITAL_COMMUNITY): Payer: Self-pay | Admitting: *Deleted

## 2014-09-15 NOTE — Telephone Encounter (Signed)
Dr. Lolly MustacheArfeen,   Patient called.  Patient stated her OBGYN office is putting her on Brisdelle for hot flashes.  Patient stated that her OBGYN wanted her to make sure she can take this medication with her psych medications that are prescribed by you. Please advise.   Thank you

## 2014-09-19 ENCOUNTER — Telehealth (HOSPITAL_COMMUNITY): Payer: Self-pay | Admitting: Psychiatry

## 2014-09-19 NOTE — Telephone Encounter (Signed)
I returned patient's phone call.  Recently she was prescribed Brisdelle for hot flashes. I explained that new medicine is anti depressant and may cause mania.  Patient has history of bipolar.  She decided not to start this medication.

## 2014-10-03 ENCOUNTER — Encounter: Payer: Self-pay | Admitting: Psychiatry

## 2014-11-04 ENCOUNTER — Other Ambulatory Visit (HOSPITAL_COMMUNITY): Payer: Self-pay | Admitting: Psychiatry

## 2014-11-07 ENCOUNTER — Other Ambulatory Visit (HOSPITAL_COMMUNITY): Payer: Self-pay | Admitting: Psychiatry

## 2014-11-29 ENCOUNTER — Ambulatory Visit (HOSPITAL_COMMUNITY): Payer: Self-pay | Admitting: Psychiatry

## 2014-12-06 ENCOUNTER — Ambulatory Visit (HOSPITAL_COMMUNITY): Payer: Self-pay | Admitting: Psychiatry

## 2014-12-14 ENCOUNTER — Telehealth (HOSPITAL_COMMUNITY): Payer: Self-pay

## 2014-12-21 ENCOUNTER — Ambulatory Visit (INDEPENDENT_AMBULATORY_CARE_PROVIDER_SITE_OTHER): Payer: 59 | Admitting: Psychiatry

## 2014-12-21 ENCOUNTER — Encounter (HOSPITAL_COMMUNITY): Payer: Self-pay | Admitting: Psychiatry

## 2014-12-21 VITALS — BP 132/90 | HR 77 | Ht 69.0 in | Wt 164.4 lb

## 2014-12-21 DIAGNOSIS — F1120 Opioid dependence, uncomplicated: Secondary | ICD-10-CM

## 2014-12-21 DIAGNOSIS — F319 Bipolar disorder, unspecified: Secondary | ICD-10-CM | POA: Diagnosis not present

## 2014-12-21 DIAGNOSIS — F1010 Alcohol abuse, uncomplicated: Secondary | ICD-10-CM | POA: Diagnosis not present

## 2014-12-21 MED ORDER — RISPERIDONE 0.5 MG PO TABS: 0.5000 mg | ORAL_TABLET | Freq: Every day | ORAL | Status: DC

## 2014-12-21 MED ORDER — VENLAFAXINE HCL 75 MG PO TABS: ORAL_TABLET | ORAL | Status: DC

## 2014-12-21 MED ORDER — GABAPENTIN 300 MG PO CAPS: ORAL_CAPSULE | ORAL | Status: DC

## 2014-12-21 MED ORDER — LAMOTRIGINE 200 MG PO TABS: ORAL_TABLET | ORAL | Status: DC

## 2014-12-21 NOTE — Progress Notes (Signed)
[facility] [id number] Progress Note  Jeanette Bell [id number] 44 y.o.  Chief Complaint:  I cut down the Risperdal.  1 mg is making me very sleepy and groggy.        History of Present Illness:  Jeanette Bell came for her followup appointment.  She is taking Risperdal 0.5 mg because she tried 1 mg and felt very groggy next day.  She still have moments of irritability, mood swings but she believe otherwise she is doing much better.  She is taking Neurontin, Lamictal and denies any rash or itching.  She denies any headaches.  Recently she's seen her endocrinologist and found to be high prolactin level.  She is relieved repeat level came down.  She has another appointment in September.  She reported her mood has been stable.  She denies any agitation, anger, mood swing.  She is taking Lamictal, Effexor and Neurontin.  She admitted sometimes she takes hydroxyzine when she is very stressed out.  She denies any major panic attack.  She is happy that all 3 children are in college.  Her twins enrolled in [place] and daughter is junior in college. She denies any drinking or using any illegal substances.  She has been not going to AA meeting because her schedule his been very busy.  She is working as a Lexicographer. .  She enjoys her job.  She lives with her husband who is very supportive.  She has no rash or itching.  Her appetite is okay.  Her vitals are stable.  Suicidal Ideation: No Plan Formed: No Patient has means to carry out plan: No  Homicidal Ideation: No Plan Formed: No Patient has means to carry out plan: No  Review of Systems  Skin: Negative for itching and rash.  Neurological: Negative for tremors.  Psychiatric/Behavioral: Negative for suicidal ideas, hallucinations and substance abuse.   Psychiatric: Agitation: No Hallucination: No Depressed Mood: No Insomnia: No Hypersomnia: No Altered Concentration: No Feels Worthless: No Grandiose Ideas: No Belief In Special Powers:  No New/Increased Substance Abuse: No Compulsions: No  Neurologic: Headache: No Seizure: No Paresthesias: No  Past Medical History:  Patient has chronic pain.  Outpatient Encounter Prescriptions as of 12/21/2014  Medication Sig  . acetaminophen (TYLENOL) 500 MG tablet Take 1,000 mg by mouth every 6 (six) hours as needed. For pain  . gabapentin (NEURONTIN) 300 MG capsule Take 1 capsule by mouth 3  times daily  . hydrOXYzine (ATARAX/VISTARIL) 25 MG tablet Take 1 tablet by mouth  twice a day as needed  . lamoTRIgine (LAMICTAL) 200 MG tablet Take 1 tablet by mouth two  times daily  . risperiDONE (RISPERDAL) 0.5 MG tablet Take 1 tablet (0.5 mg total) by mouth at bedtime.  Marland Kitchen tiZANidine (ZANAFLEX) 2 MG tablet   . tiZANidine (ZANAFLEX) 4 MG tablet   . venlafaxine (EFFEXOR) 75 MG tablet Take 1 tablet by mouth  daily  . Vitamin D, Ergocalciferol, (DRISDOL) 50000 UNITS CAPS capsule   . [DISCONTINUED] gabapentin (NEURONTIN) 300 MG capsule Take 1 capsule by mouth 3  times daily  . [DISCONTINUED] lamoTRIgine (LAMICTAL) 200 MG tablet Take 1 tablet by mouth two  times daily  . [DISCONTINUED] risperiDONE (RISPERDAL) 1 MG tablet Take 1 tablet (1 mg total) by mouth at bedtime.  . [DISCONTINUED] venlafaxine (EFFEXOR) 75 MG tablet Take 1 tablet by mouth  daily   No facility-administered encounter medications on file as of 12/21/2014.    Past Psychiatric History/Hospitalization(s): Patient has one psychiatric  hospitalization in 2013 after taking overdose on her Xanax, oxycodone and alcohol.  She has history of bipolar disorder.  In the past she was seen psychiatrist at [facility] and Dr Evelene Croon.  Anxiety: Yes Bipolar Disorder: Yes Depression: Yes Mania: Yes Psychosis: No Schizophrenia: No Personality Disorder: No Hospitalization for psychiatric illness: Yes History of Electroconvulsive Shock Therapy: No Prior Suicide Attempts: No  Physical Exam: Constitutional:  BP 132/90 mmHg   Pulse 77  Ht  (1.753 m)  Wt 164 lb 6.4 oz (74.571 kg)  BMI 24.27 kg/m2  General Appearance: alert, oriented, no acute distress, well nourished and well groomed and casually dressed  Musculoskeletal: Strength & Muscle Tone: within normal limits Gait & Station: normal Patient leans: N/A  Mental status examination: Patient is casually dressed and groomed.  Her speech is fast, clear and coherent.  Her thought process logical and goal-directed.  Her attention and concentration is fair. She denies any auditory or visual hallucination.  She denies any active or passive suicidal thoughts or homicidal thought.  There were no paranoia, delusion or any obsessive thoughts.  Her fund of knowledge is adequate.  Her psychomotor activity is slightly increased.  Her cognition is intact.  She's alert and oriented 3.  Her insight judgment and impulse control is okay.  Established Problem, Stable/Improving (1), Review or order clinical lab tests (1), Review of Last Therapy Session (1), Review of Medication Regimen & Side Effects (2) and Review of New Medication or Change in Dosage (2)  Assessment: Axis I: Bipolar disorder not otherwise specified, history of opiate dependence, history of alcohol abuse  Axis II: Deferred  Axis III: Chronic left hip pain, lumbar spine pain  Plan:  Patient does not want to take Risperdal 1 mg because it is making her very sleepy and groggy.  We talked about high prolactin level which could be due to Risperdal.  Patient is not interested to change her medication and she prefer low-dose Risperdal.  She has another appointment in September with endocrinologist and she will get another prolactin level.  I will continue Risperdal 0.5 mg at bedtime, Neurontin 300 mg 3 times a day, Lamictal 200 mg daily and Effexor 75 mg daily.  Patient is taking hydroxyzine 25 mg as needed.  She does not need any new refill.  Recommended to call us back if she has any question or any concern.   Follow-up in 3 months.  Siobhan Zaro T., MD 12/21/2014

## 2014-12-23 ENCOUNTER — Other Ambulatory Visit: Payer: Self-pay | Admitting: Anesthesiology

## 2014-12-23 DIAGNOSIS — M47816 Spondylosis without myelopathy or radiculopathy, lumbar region: Secondary | ICD-10-CM

## 2014-12-27 ENCOUNTER — Ambulatory Visit
Admission: RE | Admit: 2014-12-27 | Discharge: 2014-12-27 | Disposition: A | Discharge status: Home / Self Care | Payer: 59 | Source: Ambulatory Visit | Attending: Anesthesiology | Admitting: Anesthesiology

## 2014-12-27 DIAGNOSIS — M47896 Other spondylosis, lumbar region: Secondary | ICD-10-CM | POA: Insufficient documentation

## 2014-12-27 DIAGNOSIS — M47816 Spondylosis without myelopathy or radiculopathy, lumbar region: Secondary | ICD-10-CM

## 2015-03-19 ENCOUNTER — Ambulatory Visit: Admit: 2015-03-19 | Payer: Self-pay | Admitting: Gastroenterology

## 2015-03-19 ENCOUNTER — Encounter
Admission: EM | Disposition: A | Discharge status: Home / Self Care | Payer: Self-pay | Source: Home / Self Care | Attending: Student

## 2015-03-19 ENCOUNTER — Emergency Department: Payer: 59

## 2015-03-19 ENCOUNTER — Emergency Department: Payer: 59 | Admitting: Anesthesiology

## 2015-03-19 ENCOUNTER — Encounter: Payer: Self-pay | Admitting: Anesthesiology

## 2015-03-19 ENCOUNTER — Ambulatory Visit
Admission: EM | Admit: 2015-03-19 | Discharge: 2015-03-19 | Disposition: A | Discharge status: Home / Self Care | Payer: 59 | Attending: Student | Admitting: Student

## 2015-03-19 DIAGNOSIS — R0789 Other chest pain: Secondary | ICD-10-CM | POA: Insufficient documentation

## 2015-03-19 DIAGNOSIS — F17200 Nicotine dependence, unspecified, uncomplicated: Secondary | ICD-10-CM | POA: Diagnosis not present

## 2015-03-19 DIAGNOSIS — K222 Esophageal obstruction: Secondary | ICD-10-CM

## 2015-03-19 DIAGNOSIS — X58XXXA Exposure to other specified factors, initial encounter: Secondary | ICD-10-CM | POA: Diagnosis not present

## 2015-03-19 DIAGNOSIS — T18128A Food in esophagus causing other injury, initial encounter: Secondary | ICD-10-CM | POA: Diagnosis not present

## 2015-03-19 HISTORY — PX: ESOPHAGOGASTRODUODENOSCOPY: SHX5428

## 2015-03-19 LAB — CBC
HEMATOCRIT: 39.5 % (ref 35.0–47.0)
HEMOGLOBIN: 13.7 g/dL (ref 12.0–16.0)
MCH: 34.7 pg — ABNORMAL HIGH (ref 26.0–34.0)
MCHC: 34.7 g/dL (ref 32.0–36.0)
MCV: 99.9 fL (ref 80.0–100.0)
Platelets: 246 10*3/uL (ref 150–440)
RBC: 3.95 MIL/uL (ref 3.80–5.20)
RDW: 13.9 % (ref 11.5–14.5)
WBC: 7.4 10*3/uL (ref 3.6–11.0)

## 2015-03-19 LAB — COMPREHENSIVE METABOLIC PANEL
ALT: 14 U/L (ref 14–54)
AST: 17 U/L (ref 15–41)
Albumin: 4.4 g/dL (ref 3.5–5.0)
Alkaline Phosphatase: 86 U/L (ref 38–126)
Anion gap: 9 (ref 5–15)
BILIRUBIN TOTAL: 0.4 mg/dL (ref 0.3–1.2)
BUN: 8 mg/dL (ref 6–20)
CALCIUM: 9 mg/dL (ref 8.9–10.3)
CHLORIDE: 106 mmol/L (ref 101–111)
CO2: 25 mmol/L (ref 22–32)
Creatinine, Ser: 0.64 mg/dL (ref 0.44–1.00)
GFR calc Af Amer: >60 mL/min (ref >60)
Glucose, Bld: 86 mg/dL (ref 65–99)
Potassium: 3.8 mmol/L (ref 3.5–5.1)
Sodium: 140 mmol/L (ref 135–145)
Total Protein: 8.2 g/dL — ABNORMAL HIGH (ref 6.5–8.1)

## 2015-03-19 LAB — PROTIME-INR
INR: 0.88
PROTHROMBIN TIME: 12.1 s (ref 11.4–15.0)

## 2015-03-19 LAB — APTT: aPTT: 38 seconds — ABNORMAL HIGH (ref 24–36)

## 2015-03-19 LAB — POCT PREGNANCY, URINE: Preg Test, Ur: NEGATIVE

## 2015-03-19 SURGERY — EGD (ESOPHAGOGASTRODUODENOSCOPY)
Anesthesia: General | Laterality: Left

## 2015-03-19 SURGERY — EGD (ESOPHAGOGASTRODUODENOSCOPY)
Anesthesia: General

## 2015-03-19 MED ORDER — GLUCAGON HCL RDNA (DIAGNOSTIC) 1 MG IJ SOLR
INTRAMUSCULAR | Status: AC
  Administered 2015-03-19: 1 mg via INTRAVENOUS
  Filled 2015-03-19: qty 1

## 2015-03-19 MED ORDER — FENTANYL CITRATE (PF) 100 MCG/2ML IJ SOLN
25.0000 ug | INTRAMUSCULAR | Status: DC | PRN
  Administered 2015-03-19 (×4): 25 ug via INTRAVENOUS

## 2015-03-19 MED ORDER — LIDOCAINE HCL (CARDIAC) 20 MG/ML IV SOLN
INTRAVENOUS | Status: DC | PRN
  Administered 2015-03-19: 50 mg via INTRAVENOUS

## 2015-03-19 MED ORDER — SODIUM CHLORIDE 0.9 % IV SOLN: INTRAVENOUS | Status: DC

## 2015-03-19 MED ORDER — SODIUM CHLORIDE 0.9 % IV BOLUS (SEPSIS): 500.0000 mL | Freq: Once | INTRAVENOUS | Status: DC

## 2015-03-19 MED ORDER — LACTATED RINGERS IV SOLN
INTRAVENOUS | Status: DC | PRN
  Administered 2015-03-19: 20:00:00 via INTRAVENOUS

## 2015-03-19 MED ORDER — ONDANSETRON HCL 4 MG/2ML IJ SOLN: 4.0000 mg | Freq: Once | INTRAMUSCULAR | Status: DC | PRN

## 2015-03-19 MED ORDER — PROPOFOL 10 MG/ML IV BOLUS
INTRAVENOUS | Status: DC | PRN
  Administered 2015-03-19: 140 mg via INTRAVENOUS

## 2015-03-19 MED ORDER — SUCCINYLCHOLINE CHLORIDE 20 MG/ML IJ SOLN
INTRAMUSCULAR | Status: DC | PRN
  Administered 2015-03-19: 100 mg via INTRAVENOUS

## 2015-03-19 MED ORDER — GLUCAGON HCL (RDNA) 1 MG IJ SOLR
1.0000 mg | Freq: Once | INTRAMUSCULAR | Status: AC
  Administered 2015-03-19: 1 mg via INTRAVENOUS
  Filled 2015-03-19 (×2): qty 1

## 2015-03-19 MED ORDER — SODIUM CHLORIDE 0.9 % IV BOLUS (SEPSIS)
500.0000 mL | Freq: Once | INTRAVENOUS | Status: AC
  Administered 2015-03-19: 500 mL via INTRAVENOUS

## 2015-03-19 NOTE — Transfer of Care (Signed)
Immediate Anesthesia Transfer of Care Note  Patient: Jeanette Bell  Procedure(s) Performed: Procedure(s): ESOPHAGOGASTRODUODENOSCOPY (EGD) (N/A)  Patient Location: PACU  Anesthesia Type:General  Level of Consciousness: awake, alert  and oriented  Airway & Oxygen Therapy: Patient Spontanous Breathing  Post-op Assessment: Report given to RN  Post vital signs: stable  Last Vitals:  Filed Vitals:   03/19/15 2005  BP: 125/60  Pulse: 91  Temp: 36.4 C  Resp: 16    Complications: No apparent anesthesia complications

## 2015-03-19 NOTE — Anesthesia Preprocedure Evaluation (Signed)
Anesthesia Evaluation  Patient identified by MRN, date of birth, ID band Patient awake    Reviewed: Allergy & Precautions, NPO status , Patient's Chart, lab work & pertinent test results, reviewed documented beta blocker date and time   Airway Mallampati: II  TM Distance: >3 FB     Dental  (+) Chipped   Pulmonary former smoker,           Cardiovascular      Neuro/Psych PSYCHIATRIC DISORDERS Depression Bipolar Disorder    GI/Hepatic   Endo/Other    Renal/GU      Musculoskeletal   Abdominal   Peds  Hematology   Anesthesia Other Findings   Reproductive/Obstetrics                             Anesthesia Physical Anesthesia Plan  ASA: II  Anesthesia Plan: General   Post-op Pain Management:    Induction: Intravenous  Airway Management Planned: Oral ETT  Additional Equipment:   Intra-op Plan:   Post-operative Plan:   Informed Consent: I have reviewed the patients History and Physical, chart, labs and discussed the procedure including the risks, benefits and alternatives for the proposed anesthesia with the patient or authorized representative who has indicated his/her understanding and acceptance.     Plan Discussed with: CRNA  Anesthesia Plan Comments:         Anesthesia Quick Evaluation

## 2015-03-19 NOTE — Anesthesia Procedure Notes (Signed)
Procedure Name: Intubation Date/Time: 03/19/2015 7:40 PM Performed by: ZOXWRUEKILDUFF, Yvonne Stopher Pre-anesthesia Checklist: Patient identified, Suction available, Patient being monitored and Timeout performed Oxygen Delivery Method: Circle system utilized Preoxygenation: Pre-oxygenation with 100% oxygen Intubation Type: IV induction Grade View: Grade II Tube type: Oral Number of attempts: 1 Placement Confirmation: ETT inserted through vocal cords under direct vision,  positive ETCO2,  CO2 detector and breath sounds checked- equal and bilateral Secured at: 7.5 cm Tube secured with: Tape

## 2015-03-19 NOTE — ED Notes (Signed)
Patient transported to GI lab via stretcher by Wandra MannanNancy RN, Son at bedside.

## 2015-03-19 NOTE — Anesthesia Postprocedure Evaluation (Signed)
  Anesthesia Post-op Note  Patient: Jeanette Bell  Procedure(s) Performed: Procedure(s): ESOPHAGOGASTRODUODENOSCOPY (EGD) (N/A)  Anesthesia type:General  Patient location: PACU  Post pain: Pain level controlled  Post assessment: Post-op Vital signs reviewed, Patient's Cardiovascular Status Stable, Respiratory Function Stable, Patent Airway and No signs of Nausea or vomiting  Post vital signs: Reviewed and stable  Last Vitals:  Filed Vitals:   03/19/15 2045  BP: 143/78  Pulse: 83  Temp:   Resp:     Level of consciousness: awake, alert  and patient cooperative  Complications: No apparent anesthesia complications

## 2015-03-19 NOTE — OR Nursing (Signed)
Pt intubated by D. Francis GainesKilduff CRNA and M. Maisie Fushomas, MD.  Extubated without incident. Pt tolerated well. Transferred to PACU per protocol.

## 2015-03-19 NOTE — ED Notes (Signed)
Patient states she has not had anything to eat since 1500 today and has tried to drink liquids but vomits them back up.

## 2015-03-19 NOTE — ED Notes (Addendum)
Pt states she was eating steak and that it is stuck in her throat. Denies any respiratory complaints. No distress noted Pt also reports chest pain where she says is caused due to her trying to throw up the steak.

## 2015-03-19 NOTE — ED Notes (Signed)
Phone call received from Endoscopy-Nurse says MD is putting orders in now.  Told RN that we do not have orders for consent at this time.

## 2015-03-19 NOTE — ED Provider Notes (Signed)
[facility] Emergency Department Provider Note  ____________________________________________  Time seen: Approximately 5:36 PM  I have reviewed the triage vital signs and the nursing notes.   HISTORY  Chief Complaint Aspiration    HPI Jeanette Bell is a 44 y.o. female history of bipolar disorder, prior esophageal food impaction with endoscopic retrieval and esophageal dilatation who presents for evaluation of sensation of food impaction which began suddenly just prior to arrival has been constant. Patient reports that she was eating steak at approximately 3:30 PM. She believes it got "hung up" in her esophagus. She is complaining of some chest pain at this time. No vomiting or diarrhea. Currently her symptoms are moderate to severe. She attempted to vomit up the steak without success. There are no modifying factors. Prior to today she had been in her usual state of health without illness.   Past Medical History  Diagnosis Date  . Chronic hip pain     left hip. treated with radiofrequency  . Depression   . Bipolar 1 disorder, depressed (HCC) 03/18/2012    Patient Active Problem List   Diagnosis Date Noted  . 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 Other and unspecified alcohol dependence, unspecified drinking behavior 03/31/2012  . Opioid type dependence, unspecified 03/31/2012  . Bipolar 1 disorder, depressed (HCC) 03/18/2012    Past Surgical History  Procedure Laterality Date  . C cection    . No past surgeries    . Back surgery    . Abdominal hysterectomy      uterine ablatione (removal of lining of uterus)    No current outpatient prescriptions on file.  Allergies Ace inhibitors  Family History  Problem Relation Age of Onset  . Depression Mother   . Alcohol abuse Father   . Bipolar disorder Sister   . Depression Maternal Aunt   . Depression Maternal Grandmother     Social History Social History  Substance  Use Topics  . Smoking status: Former Smoker -- 0.25 packs/day    Types: Cigarettes  . Smokeless tobacco: None  . Alcohol Use: 4.8 oz/week    2 Glasses of wine, 2 Cans of beer, 2 Shots of liquor, 2 Standard drinks or equivalent per week     Comment: last use Dec 2013    Review of Systems Constitutional: No fever/chills Eyes: No visual changes. ENT: No sore throat. Cardiovascular: + chest pain. Respiratory: Denies shortness of breath. Gastrointestinal: No abdominal pain.  + nausea, no vomiting.  No diarrhea.  No constipation. Genitourinary: Negative for dysuria. Musculoskeletal: Negative for back pain. Skin: Negative for rash. Neurological: Negative for headaches, focal weakness or numbness.  10-point ROS otherwise negative.  ____________________________________________   PHYSICAL EXAM:  VITAL SIGNS: ED Triage Vitals  Enc Vitals Group     BP 03/19/15 1706 117/79 mmHg     Pulse Rate 03/19/15 1706 78     Resp 03/19/15 1706 18     Temp 03/19/15 1706 98.4 F (36.9 C)     Temp Source 03/19/15 1706 Oral     SpO2 03/19/15 1706 100 %     Weight 03/19/15 1706 168 lb (76.204 kg)     Height 03/19/15 1706  (1.753 m)     Head Cir --      Peak Flow --      Pain Score 03/19/15 1715 4     Pain Loc --      Pain Edu? --      Excl.  in GC? --     Constitutional: Alert and oriented. Generally in no acute distress but she is spitting into an emesis bag at bedside. Eyes: Conjunctivae are normal. PERRL. EOMI. Head: Atraumatic. Nose: No congestion/rhinnorhea. Mouth/Throat: Mucous membranes are moist.  Oropharynx non-erythematous. Neck: No stridor. Cardiovascular: Normal rate, regular rhythm. Grossly normal heart sounds.  Good peripheral circulation. Respiratory: Normal respiratory effort.  No retractions. Lungs CTAB. Gastrointestinal: Soft and nontender. No distention.  No CVA tenderness. Genitourinary: deferred Musculoskeletal: No lower extremity tenderness nor edema.  No joint  effusions. Neurologic:  Normal speech and language. No gross focal neurologic deficits are appreciated. No gait instability. Skin:  Skin is warm, dry and intact. No rash noted. Psychiatric: Mood and affect are normal. Speech and behavior are normal.  ____________________________________________   LABS (all labs ordered are listed, but only abnormal results are displayed)  Labs Reviewed  CBC - Abnormal; Notable for the following:    MCH 34.7 (*)    All other components within normal limits  COMPREHENSIVE METABOLIC PANEL - Abnormal; Notable for the following:    Total Protein 8.2 (*)    All other components within normal limits  APTT - Abnormal; Notable for the following:    aPTT 38 (*)    All other components within normal limits  PROTIME-INR  POC URINE PREG, ED  POCT PREGNANCY, URINE   ____________________________________________  EKG  ED ECG REPORT I, Gayla DossGayle, Jarica Plass A, the attending physician, personally viewed and interpreted this ECG.   Date: 03/19/2015  EKG Time: 17:20  Rate: 75  Rhythm: normal sinus rhythm with sinus arrhythmia  Axis: right  Intervals:none  ST&T Change: No acute ST elevation.  ____________________________________________  RADIOLOGY  CXR  IMPRESSION: Stable chest. No active cardiopulmonary process.   ____________________________________________   PROCEDURES  Procedure(s) performed: None  Critical Care performed: No  ____________________________________________   INITIAL IMPRESSION / ASSESSMENT AND PLAN / ED COURSE  Pertinent labs & imaging results that were available during my care of the patient were reviewed by me and considered in my medical decision making (see chart for details).  Jeanette Bell is a 44 y.o. female history of bipolar disorder, prior esophageal food impaction with endoscopic retrieval and esophageal dilatation who presents for evaluation of sensation of food impaction which began suddenly just prior to arrival  has been constant. On exam, she is nontoxic appearing and generally does not appear to be in any acute distress but as she is spitting secretions into an emesis bedside. We'll give glucagon for treatment of esophageal food impaction and reassess.   ----------------------------------------- 6:32 PM on 03/19/2015 -----------------------------------------  Patient with no improvement after glucagon. She is not able to tolerate by mouth intake. Case discussed with Dr. Bluford Kaufmannh of GI who will arrange for emergent endoscopy. ____________________________________________   FINAL CLINICAL IMPRESSION(S) / ED DIAGNOSES  Final diagnoses:  Esophageal obstruction due to food impaction      Gayla DossEryka A Uvaldo Rybacki, MD 03/19/15 [phone]

## 2015-03-20 ENCOUNTER — Ambulatory Visit: Admit: 2015-03-20 | Payer: Self-pay | Admitting: Gastroenterology

## 2015-03-20 ENCOUNTER — Other Ambulatory Visit (HOSPITAL_COMMUNITY): Payer: Self-pay | Admitting: Psychiatry

## 2015-03-20 NOTE — Op Note (Signed)
[facility]enterology Patient Name: Jeanette Bell Procedure Date: 03/19/2015 7:13 PM MRN: [id number] Account #: [id number] Date of Birth: 02/07/1971 Admit Type: Emergency Department Age: [age] [facility]OM 4 Gender: Female Note Status: Finalized Procedure:         Upper GI endoscopy Indications:       Foreign body in the esophagus Providers:         Ezzard StandingPaul Y. Bluford Kaufmannh, MD Medicines:         General Anesthesia Complications:     No immediate complications. Procedure:         Pre-Anesthesia Assessment:                    - Prior to the procedure, a History and Physical was                     performed, and patient medications, allergies and                     sensitivities were reviewed. The patient's tolerance of                     previous anesthesia was reviewed.                    - The risks and benefits of the procedure and the sedation                     options and risks were discussed with the patient. All                     questions were answered and informed consent was obtained.                    - After reviewing the risks and benefits, the patient was                     deemed in satisfactory condition to undergo the procedure.                    After obtaining informed consent, the endoscope was passed                     under direct vision. Throughout the procedure, the                     patient's blood pressure, pulse, and oxygen saturations                     were monitored continuously. The Endoscope was introduced                     through the mouth, and advanced to the second part of                     duodenum. The upper GI endoscopy was accomplished without                     difficulty. The patient tolerated the procedure well. Findings:      Food was found in the lower third of the esophagus. Removal of food was       accomplished.      The exam was otherwise without abnormality.      The entire examined stomach was  normal.  The examined duodenum was normal. Impression:        - Food in the lower third of the esophagus. Removal was                     successful.                    - The examination was otherwise normal.                    - Normal stomach.                    - Normal examined duodenum. Recommendation:    - Discharge patient to home.                    - Observe patient's clinical course.                    - The findings and recommendations were discussed with the                     patient's family.                    - Need to chew food carefully in the future. Procedure Code(s): --- Professional ---                    [id number], Esophagogastroduodenoscopy, flexible, transoral;                     with removal of foreign body(s) Diagnosis Code(s): --- Professional ---                    A21.308M, Food in esophagus causing other injury, initial                     encounter                    T18.108A, Unspecified foreign body in esophagus causing                     other injury, initial encounter CPT copyright 2014 [employer]. All rights reserved. The codes documented in this report are preliminary and upon coder review may  be revised to meet current compliance requirements. Wallace Cullens, MD 03/19/2015 7:54:54 PM This report has been signed electronically. Number of Addenda: 0 Note Initiated On: 03/19/2015 7:13 PM      [facility]

## 2015-03-21 ENCOUNTER — Encounter: Payer: Self-pay | Admitting: Gastroenterology

## 2015-03-23 ENCOUNTER — Other Ambulatory Visit (HOSPITAL_COMMUNITY): Payer: Self-pay | Admitting: Psychiatry

## 2015-03-23 ENCOUNTER — Ambulatory Visit (HOSPITAL_COMMUNITY): Payer: Self-pay | Admitting: Psychiatry

## 2015-03-23 DIAGNOSIS — F319 Bipolar disorder, unspecified: Secondary | ICD-10-CM

## 2015-03-23 MED ORDER — HYDROXYZINE HCL 25 MG PO TABS: ORAL_TABLET | ORAL | Status: DC

## 2015-04-01 ENCOUNTER — Other Ambulatory Visit (HOSPITAL_COMMUNITY): Payer: Self-pay | Admitting: Psychiatry

## 2015-04-03 ENCOUNTER — Ambulatory Visit (INDEPENDENT_AMBULATORY_CARE_PROVIDER_SITE_OTHER): Payer: 59 | Admitting: Psychiatry

## 2015-04-03 ENCOUNTER — Encounter (HOSPITAL_COMMUNITY): Payer: Self-pay | Admitting: Psychiatry

## 2015-04-03 VITALS — BP 129/84 | HR 98 | Ht 69.0 in | Wt 160.0 lb

## 2015-04-03 DIAGNOSIS — F1120 Opioid dependence, uncomplicated: Secondary | ICD-10-CM | POA: Diagnosis not present

## 2015-04-03 DIAGNOSIS — F319 Bipolar disorder, unspecified: Secondary | ICD-10-CM | POA: Diagnosis not present

## 2015-04-03 MED ORDER — VENLAFAXINE HCL 75 MG PO TABS: ORAL_TABLET | ORAL | Status: DC

## 2015-04-03 MED ORDER — GABAPENTIN 300 MG PO CAPS: ORAL_CAPSULE | ORAL | Status: DC

## 2015-04-03 MED ORDER — RISPERIDONE 0.5 MG PO TABS: 0.5000 mg | ORAL_TABLET | Freq: Every day | ORAL | Status: DC

## 2015-04-03 MED ORDER — LAMOTRIGINE 200 MG PO TABS: ORAL_TABLET | ORAL | Status: DC

## 2015-04-03 NOTE — Progress Notes (Addendum)
[facility] [id number] Progress Note  IMAGINE NEST [id number] 44 y.o.  Chief Complaint:  My prolactin level is increased.  My doctor started me on a new medication.  I don't want to change my Risperdal.          History of Present Illness:  Jeanette Bell came for her followup appointment.  She is compliant with her psychiatric medication.  She is taking Risperdal, Lamictal, Vistaril, Effexor and Neurontin.  She had blood work and found to have a high prolactin level.  Her endocrinologist started her on cabergeline 0.5 mg twice a week.  She had a low blood work in January .  We talked about lowering further Risperdal but patient does not want to change before the holidays.  She feel her medicine is working very well.  She denies any irritability, anger, mood swing.  She recently had a good time with her father who lives in [place].  Her father is coming on Thanksgiving and she is excited about it.  She has no tremors, shakes, EPS or any rash or itching.  She feels proud that she is not drinking or using any illegal substances.  She is taking muscle relaxant .  She is not taking any opiate pain medication.  She admitted sometimes she takes hydroxyzine when she is very stressed out.  She denies any major panic attack.  She is happy that all 3 children are in college.  Her twins enrolled in [place] and daughter is junior in college. She is working as a Lexicographer. .  She enjoys her job.  She lives with her husband who is very supportive.  Her appetite is okay.  Her vitals are stable.  Suicidal Ideation: No Plan Formed: No Patient has means to carry out plan: No  Homicidal Ideation: No Plan Formed: No Patient has means to carry out plan: No  Review of Systems  Skin: Negative for itching and rash.  Neurological: Negative for tremors.  Psychiatric/Behavioral: Negative for suicidal ideas, hallucinations and substance abuse.   Psychiatric: Agitation: No Hallucination: No Depressed Mood:  No Insomnia: No Hypersomnia: No Altered Concentration: No Feels Worthless: No Grandiose Ideas: No Belief In Special Powers: No New/Increased Substance Abuse: No Compulsions: No  Neurologic: Headache: No Seizure: No Paresthesias: No  Past Medical History:  Patient has chronic pain.  Outpatient Encounter Prescriptions as of 04/03/2015  Medication Sig  . cabergoline (DOSTINEX) 0.5 MG tablet Take 0.25 mg by mouth 2 (two) times a week.  Marland Kitchen acetaminophen (TYLENOL) 500 MG tablet Take 1,000 mg by mouth every 6 (six) hours as needed. For pain  . gabapentin (NEURONTIN) 300 MG capsule Take 1 capsule by mouth 3  times daily  . hydrOXYzine (ATARAX/VISTARIL) 25 MG tablet Take 1 tablet by mouth  twice a day as needed  . lamoTRIgine (LAMICTAL) 200 MG tablet Take 1 tablet by mouth two  times daily  . naproxen (NAPROSYN) 500 MG tablet Take 500 mg by mouth every 12 (twelve) hours as needed. For pain.  Marland Kitchen propranolol (INDERAL) 20 MG tablet Take 20 mg by mouth 2 (two) times daily.  . risperiDONE (RISPERDAL) 0.5 MG tablet Take 1 tablet (0.5 mg total) by mouth at bedtime.  Marland Kitchen tiZANidine (ZANAFLEX) 2 MG tablet Take 2 mg by mouth 4 (four) times daily. Take along with 4 mg tablet.  Marland Kitchen tiZANidine (ZANAFLEX) 4 MG tablet Take 4 mg by mouth 4 (four) times daily. Take along with 2 mg tablet.  . venlafaxine [facility])  75 MG tablet Take 1 tablet by mouth  daily  . [DISCONTINUED] gabapentin (NEURONTIN) 300 MG capsule Take 1 capsule by mouth 3  times daily  . [DISCONTINUED] lamoTRIgine (LAMICTAL) 200 MG tablet Take 1 tablet by mouth two  times daily (Patient taking differently: Take 200 mg by mouth daily. )  . [DISCONTINUED] risperiDONE (RISPERDAL) 0.5 MG tablet Take 1 tablet (0.5 mg total) by mouth at bedtime.  . [DISCONTINUED] venlafaxine (EFFEXOR) 75 MG tablet Take 1 tablet by mouth  daily   No facility-administered encounter medications on file as of 04/03/2015.    Past Psychiatric  History/Hospitalization(s): Patient has one psychiatric hospitalization in 2013 after taking overdose on her Xanax, oxycodone and alcohol.  She has history of bipolar disorder.  [facility] the [facility] and Dr Evelene CroonKaur.  Anxiety: Yes Bipolar Disorder: Yes Depression: Yes Mania: Yes Psychosis: No Schizophrenia: No Personality Disorder: No Hospitalization for psychiatric illness: Yes History of Electroconvulsive Shock Therapy: No Prior Suicide Attempts: No  Physical Exam: Constitutional:  BP 129/84 mmHg  Pulse 98  Ht 5\' 9"  (1.753 m)  Wt 160 lb (72.576 kg)  BMI 23.62 kg/m2  General Appearance: alert, oriented, no acute distress, well nourished and well groomed and casually dressed  Musculoskeletal: Strength & Muscle Tone: within normal limits Gait & Station: normal Patient leans: N/A  Mental status examination: Patient is casually dressed and groomed.  Her speech is fast, clear and coherent.  Her thought process logical and goal-directed.  Her attention and concentration is fair. She denies any auditory or visual hallucination.  She denies any active or passive suicidal thoughts or homicidal thought.  There were no paranoia, delusion or any obsessive thoughts.  Her fund of knowledge is adequate.  Her psychomotor activity is slightly increased.  Her cognition is intact.  She's alert and oriented 3.  Her insight judgment and impulse control is okay.  Established Problem, Stable/Improving (1), Review or order clinical lab tests (1), Decision to obtain old records (1), Review and summation of old records (2), New Problem, with no additional work-up planned (3), Review of Last Therapy Session (1), Review of Medication Regimen & Side Effects (2) and Review of New Medication or Change in Dosage (2)  Assessment: Axis I: Bipolar disorder not otherwise specified, history of opiate dependence, history of alcohol abuse  Axis II: Deferred  Axis III:  Chronic left hip pain, lumbar spine pain  Plan:   I reviewed records from her endocrinologist, current medication and recent blood work results.  I had a long discussion with the patient about her high prolactin level.  It is unclear why she had a high level.  It could be due to Risperdal but patient is started taking Cabergeline and she will have a repeat rectal level in January.  I suggested if her prolactin level does not get better then she should try to come off from Risperdal .  She is still taking Neurontin, Lamictal and Effexor.  If needed we can try a different antipsychotic medication to help her mood.  Patient agreed with the plan.  At this time we will not change any her psychiatric medication.  I will continue Risperdal 0.5 mg at bedtime, Neurontin 300 mg 3 times a day, Lamictal 200 mg daily and Effexor 75 mg daily.  Patient is taking hydroxyzine 25 mg as needed.  She does not need any new refill.  Recommended to call us back if she has any question or any  concern.  Follow-up in 3 months.  Levent Kornegay T., MD 04/03/2015

## 2015-07-04 ENCOUNTER — Other Ambulatory Visit (HOSPITAL_COMMUNITY): Payer: Self-pay | Admitting: Psychiatry

## 2015-07-04 ENCOUNTER — Ambulatory Visit (HOSPITAL_COMMUNITY): Payer: Self-pay | Admitting: Psychiatry

## 2015-07-04 DIAGNOSIS — F3162 Bipolar disorder, current episode mixed, moderate: Secondary | ICD-10-CM

## 2015-07-11 NOTE — Telephone Encounter (Signed)
Met with Dr. Adele Schilder to discuss medication refill requests received for patient's Gabapentin, Lamictal and Effexor as Dr. Adele Schilder approved a one time order of each due to multiple past cancelled and rescheduled appointments.  All 3 orders e-scribed to patient's [facility] pharmacy with instruction evaluation required for further refills. Patient is now scheduled for 07/24/15.

## 2015-07-24 ENCOUNTER — Ambulatory Visit (INDEPENDENT_AMBULATORY_CARE_PROVIDER_SITE_OTHER): Payer: 59 | Admitting: Psychiatry

## 2015-07-24 ENCOUNTER — Encounter (HOSPITAL_COMMUNITY): Payer: Self-pay | Admitting: Psychiatry

## 2015-07-24 VITALS — BP 146/97 | HR 83 | Ht 69.0 in | Wt 165.6 lb

## 2015-07-24 DIAGNOSIS — F319 Bipolar disorder, unspecified: Secondary | ICD-10-CM

## 2015-07-24 MED ORDER — RISPERIDONE 0.5 MG PO TABS: 0.5000 mg | ORAL_TABLET | Freq: Every day | ORAL | Status: DC

## 2015-07-24 MED ORDER — HYDROXYZINE HCL 25 MG PO TABS: ORAL_TABLET | ORAL | Status: DC

## 2015-07-24 NOTE — Progress Notes (Signed)
[facility] [id number] Progress Note  RASHAWN Bell [id number] 45 y.o.  Chief Complaint:  I'm doing very well.  My prolactin level is coming down.            History of Present Illness:  Jeanette Bell came for her followup appointment.  She is taking her medication as prescribed.  Recently she's seen her endocrinologist and she had blood work.  Her prolactin level is now 98.7.  It came down from 150 .  Patient is very happy.  She has no symptoms including any discharge, discomfort .  She is taking Risperdal 0.5 mg at bedtime which is helping her sleep.  She denies any irritability, anger, mania or any psychosis.  She endorse her mood swings are under control and she denies any recent impulsive behavior.  She likes her job.  She had a quite Christmas.  She was happy because her father came for [facility] to visit her.  Patient takes hydroxyzine 25 mg once or twice a week if she gets very anxious.  She denies any nightmares or flashback.  She is happy her children's are going very well.  Patient has twin son and a daughter..  She still have some time back pain and recently she is taken baclofen for muscle spasm and stopped taking Zanaflex.  She is scheduled to have bladder procedure on 28th.  Patient lives with her husband who is very supportive.  She works at [employer]. and she likes her job.  Patient denies any drug use.  Her appetite is okay.  Her vitals are stable.  Suicidal Ideation: No Plan Formed: No Patient has means to carry out plan: No  Homicidal Ideation: No Plan Formed: No Patient has means to carry out plan: No  Review of Systems  Constitutional: Negative.   Cardiovascular: Negative for chest pain and palpitations.  Musculoskeletal: Positive for back pain.  Skin: Negative for itching and rash.  Neurological: Negative for dizziness, tingling, tremors and headaches.  Psychiatric/Behavioral: Negative for suicidal ideas, hallucinations and substance abuse.   Psychiatric: Agitation:  No Hallucination: No Depressed Mood: No Insomnia: No Hypersomnia: No Altered Concentration: No Feels Worthless: No Grandiose Ideas: No Belief In Special Powers: No New/Increased Substance Abuse: No Compulsions: No  Neurologic: Headache: No Seizure: No Paresthesias: No  Past Medical History:  Patient has chronic pain and hyperprolactinemia.  Family History  Problem Relation Age of Onset  . Depression Mother   . Alcohol abuse Father   . Bipolar disorder Sister   . Depression Maternal Aunt   . Depression Maternal Grandmother     Outpatient Encounter Prescriptions as of 07/24/2015  Medication Sig  . acetaminophen (TYLENOL) 500 MG tablet Take 1,000 mg by mouth every 6 (six) hours as needed. For pain  . baclofen (LIORESAL) 10 MG tablet TAKE 1 TABLET WITH FOOD OR MILK THREE TIMES A DAY ORALLY 30 DAYS  . cabergoline (DOSTINEX) 0.5 MG tablet Take 0.25 mg by mouth 2 (two) times a week.  . gabapentin (NEURONTIN) 300 MG capsule Take 1 capsule by mouth 3  times daily  . hydrOXYzine (ATARAX/VISTARIL) 25 MG tablet Take 1 tablet by mouth  twice a day as needed  . lamoTRIgine (LAMICTAL) 200 MG tablet Take 1 tablet by mouth two  times daily  . meloxicam (MOBIC) 15 MG tablet Take 15 mg by mouth daily. with food  . naproxen (NAPROSYN) 500 MG tablet Take 500 mg by mouth every 12 (twelve) hours as needed. For pain.  Marland Kitchen propranolol (  INDERAL) 20 MG tablet Take 20 mg by mouth 2 (two) times daily.  . risperiDONE (RISPERDAL) 0.5 MG tablet Take 1 tablet (0.5 mg total) by mouth at bedtime.  Marland Kitchen venlafaxine (EFFEXOR) 75 MG tablet Take 1 tablet by mouth  daily  . [DISCONTINUED] hydrOXYzine (ATARAX/VISTARIL) 25 MG tablet Take 1 tablet by mouth  twice a day as needed  . [DISCONTINUED] risperiDONE (RISPERDAL) 0.5 MG tablet Take 1 tablet (0.5 mg total) by mouth at bedtime.  . [DISCONTINUED] tiZANidine (ZANAFLEX) 2 MG tablet Take 2 mg by mouth 4 (four) times daily. Take along with 4 mg tablet.  . [DISCONTINUED]  tiZANidine (ZANAFLEX) 4 MG tablet Take 4 mg by mouth 4 (four) times daily. Take along with 2 mg tablet.   No facility-administered encounter medications on file as of 07/24/2015.    Past Psychiatric History/Hospitalization(s): Patient has one psychiatric hospitalization in 2013 after taking overdose on her Xanax, oxycodone and alcohol.  She has history of bipolar disorder.  In the past she was seen psychiatrist at [facility] and Dr Evelene Croon.  Anxiety: Yes Bipolar Disorder: Yes Depression: Yes Mania: Yes Psychosis: No Schizophrenia: No Personality Disorder: No Hospitalization for psychiatric illness: Yes History of Electroconvulsive Shock Therapy: No Prior Suicide Attempts: No  Physical Exam: Constitutional:  BP 146/97 mmHg  Pulse 83  Ht  (1.753 m)  Wt 165 lb 9.6 oz (75.116 kg)  BMI 24.44 kg/m2  General Appearance: alert, oriented, no acute distress, well nourished and well groomed and casually dressed  Musculoskeletal: Strength & Muscle Tone: within normal limits Gait & Station: normal Patient leans: N/A  Mental status examination: Patient is casually dressed and groomed.  She maintained good eye contact.  Her speech is fast but clear and coherent.  Her thought process logical and goal-directed.  Her attention and concentration is fair. She denies any auditory or visual hallucination.  She denies any active or passive suicidal thoughts or homicidal thought.  There were no paranoia, delusion or any obsessive thoughts.  Her fund of knowledge is adequate.  Her psychomotor activity is slightly increased.  Her cognition is intact.  She's alert and oriented 3.  Her insight judgment and impulse control is okay.  Established Problem, Stable/Improving (1), Review or order clinical lab tests (1), Review and summation of old records (2), Review of Last Therapy Session (1), Review of Medication Regimen & Side Effects (2) and Review of New Medication or Change in Dosage  (2)  Assessment: Axis I: Bipolar disorder not otherwise specified, history of opiate dependence, history of alcohol abuse  Axis II: Deferred  Axis III: Chronic left hip pain, lumbar spine pain  Plan:  I reviewed records from her endocrinologist, current medication and recent blood work results.  Her prolactin level is coming down from 150 to 98.  Patient do not have any symptoms of discharge, discomfort at this time.  She prefers to continue Risperdal 0.5 mg at bedtime is helping her sleep, racing thoughts and manic symptoms.  She is taking carbergoline to help hyperprolactinemia from endocrinologist which is helping her.  She is scheduled to have a procedure for her bladder on 28.  I will continue her current psychiatric medication which is Lamictal 200 mg twice a day, Risperdal 0.5 mg at bedtime, Effexor 75 mg daily and Neurontin 300 mg 3 times a day.  She is taking hydroxyzine 25 mg only as needed. Patient is also taking Moban, Inderal and baclofen from her primary care physician.  Discussed polypharmacy and  drug drug interaction in detail.  At this time she does not have any tremors shakes or any EPS.  Recommended to call us back if she has any question or any concern.  Recommended to closely follow-up with her endocrinologist. Recommended to call us back if she has any question or any concern.  Follow-up in 4 months.  If patient remains symptoms free we will consider moving to 6 months on her next appointment.  Tryone Kille T., MD 07/24/2015

## 2015-09-01 ENCOUNTER — Other Ambulatory Visit (HOSPITAL_COMMUNITY): Payer: Self-pay | Admitting: Psychiatry

## 2015-09-04 ENCOUNTER — Other Ambulatory Visit (HOSPITAL_COMMUNITY): Payer: Self-pay | Admitting: Psychiatry

## 2015-10-11 ENCOUNTER — Other Ambulatory Visit (HOSPITAL_COMMUNITY): Payer: Self-pay | Admitting: Psychiatry

## 2015-10-11 ENCOUNTER — Telehealth (HOSPITAL_COMMUNITY): Payer: Self-pay

## 2015-10-11 DIAGNOSIS — F3162 Bipolar disorder, current episode mixed, moderate: Secondary | ICD-10-CM

## 2015-10-11 DIAGNOSIS — F319 Bipolar disorder, unspecified: Secondary | ICD-10-CM

## 2015-10-11 MED ORDER — GABAPENTIN 300 MG PO CAPS: ORAL_CAPSULE | ORAL | Status: DC

## 2015-10-11 MED ORDER — LAMOTRIGINE 200 MG PO TABS: ORAL_TABLET | ORAL | Status: DC

## 2015-10-11 MED ORDER — HYDROXYZINE HCL 25 MG PO TABS
ORAL_TABLET | ORAL | Status: DC
Start: 2015-10-11 — End: 2015-10-12

## 2015-10-11 MED ORDER — VENLAFAXINE HCL 75 MG PO TABS: ORAL_TABLET | ORAL | Status: DC

## 2015-10-11 NOTE — Telephone Encounter (Signed)
done

## 2015-10-11 NOTE — Telephone Encounter (Signed)
Patient was here in March and has a follow up in July. She was only givin one month on all of her prescriptions, she stated she usually gets three and she is now out of medication. Okay to refill? Please review and advise, thank you

## 2015-10-12 ENCOUNTER — Telehealth (HOSPITAL_COMMUNITY): Payer: Self-pay

## 2015-10-12 DIAGNOSIS — F3162 Bipolar disorder, current episode mixed, moderate: Secondary | ICD-10-CM

## 2015-10-12 DIAGNOSIS — F319 Bipolar disorder, unspecified: Secondary | ICD-10-CM

## 2015-10-12 MED ORDER — VENLAFAXINE HCL 75 MG PO TABS: ORAL_TABLET | ORAL | Status: DC

## 2015-10-12 MED ORDER — GABAPENTIN 300 MG PO CAPS: ORAL_CAPSULE | ORAL | Status: DC

## 2015-10-12 MED ORDER — LAMOTRIGINE 200 MG PO TABS: ORAL_TABLET | ORAL | Status: DC

## 2015-10-12 MED ORDER — HYDROXYZINE HCL 25 MG PO TABS: ORAL_TABLET | ORAL | Status: DC

## 2015-10-12 NOTE — Telephone Encounter (Signed)
Patient called and wanted the prescriptions that were sent to the pharmacy yesterday to [facility], [facility] and [facility] to d/c what was sent to them yesterday.

## 2015-10-18 ENCOUNTER — Other Ambulatory Visit (HOSPITAL_COMMUNITY): Payer: Self-pay | Admitting: Psychiatry

## 2015-11-08 ENCOUNTER — Other Ambulatory Visit (HOSPITAL_COMMUNITY): Payer: Self-pay | Admitting: Psychiatry

## 2015-11-21 ENCOUNTER — Other Ambulatory Visit (HOSPITAL_COMMUNITY): Payer: Self-pay | Admitting: Psychiatry

## 2015-11-22 ENCOUNTER — Other Ambulatory Visit (HOSPITAL_COMMUNITY): Payer: Self-pay | Admitting: Psychiatry

## 2015-11-23 ENCOUNTER — Other Ambulatory Visit (HOSPITAL_COMMUNITY): Payer: Self-pay | Admitting: Psychiatry

## 2015-11-29 ENCOUNTER — Ambulatory Visit (HOSPITAL_COMMUNITY): Payer: Self-pay | Admitting: Psychiatry

## 2015-12-20 ENCOUNTER — Other Ambulatory Visit (HOSPITAL_COMMUNITY): Payer: Self-pay | Admitting: Psychiatry

## 2016-01-01 ENCOUNTER — Ambulatory Visit (HOSPITAL_COMMUNITY): Payer: Self-pay | Admitting: Psychiatry

## 2016-01-30 ENCOUNTER — Other Ambulatory Visit (HOSPITAL_COMMUNITY): Payer: Self-pay | Admitting: Psychiatry

## 2016-01-30 DIAGNOSIS — F3162 Bipolar disorder, current episode mixed, moderate: Secondary | ICD-10-CM

## 2016-02-01 ENCOUNTER — Ambulatory Visit (HOSPITAL_COMMUNITY): Payer: Self-pay | Admitting: Psychiatry

## 2016-02-12 ENCOUNTER — Other Ambulatory Visit (HOSPITAL_COMMUNITY): Payer: Self-pay | Admitting: Psychiatry

## 2016-02-12 DIAGNOSIS — F3162 Bipolar disorder, current episode mixed, moderate: Secondary | ICD-10-CM

## 2016-02-12 DIAGNOSIS — F319 Bipolar disorder, unspecified: Secondary | ICD-10-CM

## 2016-03-07 ENCOUNTER — Ambulatory Visit (HOSPITAL_COMMUNITY): Payer: Self-pay | Admitting: Psychiatry

## 2016-03-28 ENCOUNTER — Ambulatory Visit (INDEPENDENT_AMBULATORY_CARE_PROVIDER_SITE_OTHER): Payer: 59 | Admitting: Psychiatry

## 2016-03-28 ENCOUNTER — Encounter (HOSPITAL_COMMUNITY): Payer: Self-pay | Admitting: Psychiatry

## 2016-03-28 DIAGNOSIS — Z818 Family history of other mental and behavioral disorders: Secondary | ICD-10-CM

## 2016-03-28 DIAGNOSIS — Z79899 Other long term (current) drug therapy: Secondary | ICD-10-CM

## 2016-03-28 DIAGNOSIS — F3162 Bipolar disorder, current episode mixed, moderate: Secondary | ICD-10-CM

## 2016-03-28 DIAGNOSIS — Z811 Family history of alcohol abuse and dependence: Secondary | ICD-10-CM

## 2016-03-28 DIAGNOSIS — F319 Bipolar disorder, unspecified: Secondary | ICD-10-CM

## 2016-03-28 MED ORDER — HYDROXYZINE HCL 25 MG PO TABS: 25.0000 mg | ORAL_TABLET | Freq: Three times a day (TID) | ORAL | 0 refills | Status: DC

## 2016-03-28 MED ORDER — LAMOTRIGINE 200 MG PO TABS: 200.0000 mg | ORAL_TABLET | Freq: Two times a day (BID) | ORAL | 0 refills | Status: DC

## 2016-03-28 MED ORDER — VENLAFAXINE HCL 75 MG PO TABS: 75.0000 mg | ORAL_TABLET | Freq: Every day | ORAL | 0 refills | Status: DC

## 2016-03-28 NOTE — Progress Notes (Signed)
[facility] [id number] Progress Note  Serita ButcherSarah J Bell [id number] 45 y.o.  Chief Complaint:  I stop taking Risperdal.  I still have some time irritability and frustration but overall I'm doing good.              History of Present Illness:  Jeanette SagoSarah came for her followup appointment.  She was last seen in March.  She stopped taking Risperdal a few months ago and since then her prolactin level came back normal.  She is no longer taking medication for high prolactin.  She admitted some time irritability, frustration and mood swing but denies any major psychotic episode.  She likes her job.  In summer she was upset with her husband decided to move [place] but later her husband wants to get a chance to the marriage and she decided to stay with him.  She is glad that things are going very well.  She has been not under a lot of stress from the husband.  She is taking Hydrocil seen sometimes 3 a day to help her anxiety and insomnia.  She is not interested to start any other mood stabilizer and wants to continue Lamictal 200 mg twice a day.  She has no rash, itching, headaches or any other side effects.  Despite episode of irritability she feels overall good.  Patient denies drinking alcohol or using any illegal substances.  She is working at [employer] and likes her job.  Her appetite is okay.  Her vital signs are stable.  Suicidal Ideation: No Plan Formed: No Patient has means to carry out plan: No  Homicidal Ideation: No Plan Formed: No Patient has means to carry out plan: No  Review of Systems  Constitutional: Negative.   Cardiovascular: Negative for chest pain and palpitations.  Musculoskeletal: Positive for back pain.  Skin: Negative for itching and rash.  Neurological: Negative for dizziness, tingling, tremors and headaches.  Psychiatric/Behavioral: Negative for hallucinations, substance abuse and suicidal ideas.   Psychiatric: Agitation: No Hallucination: No Depressed Mood:  No Insomnia: No Hypersomnia: No Altered Concentration: No Feels Worthless: No Grandiose Ideas: No Belief In Special Powers: No New/Increased Substance Abuse: No Compulsions: No  Neurologic: Headache: No Seizure: No Paresthesias: No  Past Medical History:  Patient has chronic pain and hyperprolactinemia.  Family History  Problem Relation Age of Onset  . Depression Mother   . Alcohol abuse Father   . Bipolar disorder Sister   . Depression Maternal Aunt   . Depression Maternal Grandmother     Outpatient Encounter Prescriptions as of 03/28/2016  Medication Sig Dispense Refill  . acetaminophen (TYLENOL) 500 MG tablet Take 1,000 mg by mouth every 6 (six) hours as needed. For pain    . baclofen (LIORESAL) 10 MG tablet TAKE 1 TABLET WITH FOOD OR MILK THREE TIMES A DAY ORALLY 30 DAYS  2  . gabapentin (NEURONTIN) 300 MG capsule TAKE 1 CAPSULE BY MOUTH 3  TIMES DAILY 270 capsule 0  . hydrOXYzine (ATARAX/VISTARIL) 25 MG tablet Take 1 tablet (25 mg total) by mouth 3 (three) times daily. 270 tablet 0  . lamoTRIgine (LAMICTAL) 200 MG tablet Take 1 tablet (200 mg total) by mouth 2 (two) times daily. 180 tablet 0  . meloxicam (MOBIC) 15 MG tablet Take 15 mg by mouth daily. with food  5  . naproxen (NAPROSYN) 500 MG tablet Take 500 mg by mouth every 12 (twelve) hours as needed. For pain.  2  . venlafaxine (EFFEXOR) 75 MG tablet Take  1 tablet (75 mg total) by mouth daily. 90 tablet 0  . [DISCONTINUED] cabergoline (DOSTINEX) 0.5 MG tablet Take 0.25 mg by mouth 2 (two) times a week.    . [DISCONTINUED] hydrOXYzine (ATARAX/VISTARIL) 25 MG tablet TAKE 1 TABLET BY MOUTH  TWICE A DAY AS NEEDED 180 tablet 0  . [DISCONTINUED] lamoTRIgine (LAMICTAL) 200 MG tablet TAKE 1 TABLET BY MOUTH TWO  TIMES DAILY 180 tablet 0  . [DISCONTINUED] propranolol (INDERAL) 20 MG tablet Take 20 mg by mouth 2 (two) times daily.  2  . [DISCONTINUED] risperiDONE (RISPERDAL) 0.5 MG tablet Take 1 tablet (0.5 mg total) by mouth  at bedtime. 90 tablet 0  . [DISCONTINUED] venlafaxine (EFFEXOR) 75 MG tablet TAKE 1 TABLET BY MOUTH  DAILY 90 tablet 0   No facility-administered encounter medications on file as of 03/28/2016.     Past Psychiatric History/Hospitalization(s): Patient has one psychiatric hospitalization in 2013 after taking overdose on her Xanax, oxycodone and alcohol.  She has history of bipolar disorder.  [facility] the [facility] and Dr Evelene CroonKaur. She has taken Risperdal for many years until stop due to high prolactin level. Anxiety: Yes Bipolar Disorder: Yes Depression: Yes Mania: Yes Psychosis: No Schizophrenia: No Personality Disorder: No Hospitalization for psychiatric illness: Yes History of Electroconvulsive Shock Therapy: No Prior Suicide Attempts: No  Physical Exam: Constitutional:  BP 118/68   Pulse 71   Ht 5\' 9"  (1.753 m)   Wt 158 lb 12.8 oz (72 kg)   BMI 23.45 kg/m   General Appearance: alert, oriented, no acute distress, well nourished and well groomed and casually dressed  Musculoskeletal: Strength & Muscle Tone: within normal limits Gait & Station: normal Patient leans: N/A  Mental status examination: Patient is casually dressed and groomed.  She described her mood happy and her affect is mood appropriate.  She maintained good eye contact.  She is pleasant and cooperative. Her speech is fast but clear and coherent.  Her thought process logical and goal-directed.  Her attention and concentration is fair. She denies any auditory or visual hallucination.  She denies any active or passive suicidal thoughts or homicidal thought.  There were no paranoia, delusion or any obsessive thoughts.  Her fund of knowledge is adequate.  Her psychomotor activity is slightly increased.  Her cognition is intact.  She's alert and oriented 3.  Her insight judgment and impulse control is okay.  Established Problem, Stable/Improving (1), Review of Last Therapy  Session (1), Review of Medication Regimen & Side Effects (2) and Review of New Medication or Change in Dosage (2)  Assessment: Axis I: Bipolar disorder not otherwise specified, history of opiate dependence, history of alcohol abuse  Axis II: Deferred  Axis III: Chronic left hip pain, lumbar spine pain  Plan:  I will discontinue Risperdal since patient is no longer taking it due to hyperactive level.  She mentioned her last blood level was normal.  I recommended to take hydroxyzine up to 3 times a day if needed for anxiety and insomnia.  Continue Lamictal 200 mg twice a day, Effexor 75 mg daily and Neurontin 300 mg 3 times a day.  Discussed medication side effects and benefits.  Recommended to call us back if she has any question, concern of worsening of the symptom.  Follow-up in 3 months.   ARFEEN,SYED T., MD 03/28/2016

## 2016-04-19 ENCOUNTER — Other Ambulatory Visit (HOSPITAL_COMMUNITY): Payer: Self-pay | Admitting: Psychiatry

## 2016-04-19 DIAGNOSIS — F3162 Bipolar disorder, current episode mixed, moderate: Secondary | ICD-10-CM

## 2016-04-19 DIAGNOSIS — F319 Bipolar disorder, unspecified: Secondary | ICD-10-CM

## 2016-04-25 ENCOUNTER — Other Ambulatory Visit (HOSPITAL_COMMUNITY): Payer: Self-pay | Admitting: Psychiatry

## 2016-05-07 ENCOUNTER — Other Ambulatory Visit (HOSPITAL_COMMUNITY): Payer: Self-pay | Admitting: Psychiatry

## 2016-05-07 DIAGNOSIS — F319 Bipolar disorder, unspecified: Secondary | ICD-10-CM

## 2016-05-07 DIAGNOSIS — F3162 Bipolar disorder, current episode mixed, moderate: Secondary | ICD-10-CM

## 2016-06-10 ENCOUNTER — Other Ambulatory Visit (HOSPITAL_COMMUNITY): Payer: Self-pay | Admitting: Psychiatry

## 2016-06-10 DIAGNOSIS — F3162 Bipolar disorder, current episode mixed, moderate: Secondary | ICD-10-CM

## 2016-06-10 DIAGNOSIS — F319 Bipolar disorder, unspecified: Secondary | ICD-10-CM

## 2016-06-11 ENCOUNTER — Other Ambulatory Visit (HOSPITAL_COMMUNITY): Payer: Self-pay | Admitting: Psychiatry

## 2016-06-11 DIAGNOSIS — F319 Bipolar disorder, unspecified: Secondary | ICD-10-CM

## 2016-06-11 DIAGNOSIS — F3162 Bipolar disorder, current episode mixed, moderate: Secondary | ICD-10-CM

## 2016-07-02 ENCOUNTER — Encounter (HOSPITAL_COMMUNITY): Payer: Self-pay | Admitting: Psychiatry

## 2016-07-02 ENCOUNTER — Ambulatory Visit (INDEPENDENT_AMBULATORY_CARE_PROVIDER_SITE_OTHER): Payer: 59 | Admitting: Psychiatry

## 2016-07-02 DIAGNOSIS — Z90710 Acquired absence of both cervix and uterus: Secondary | ICD-10-CM | POA: Diagnosis not present

## 2016-07-02 DIAGNOSIS — Z9889 Other specified postprocedural states: Secondary | ICD-10-CM

## 2016-07-02 DIAGNOSIS — Z818 Family history of other mental and behavioral disorders: Secondary | ICD-10-CM

## 2016-07-02 DIAGNOSIS — Z79899 Other long term (current) drug therapy: Secondary | ICD-10-CM

## 2016-07-02 DIAGNOSIS — F3162 Bipolar disorder, current episode mixed, moderate: Secondary | ICD-10-CM

## 2016-07-02 DIAGNOSIS — F319 Bipolar disorder, unspecified: Secondary | ICD-10-CM | POA: Diagnosis not present

## 2016-07-02 DIAGNOSIS — Z811 Family history of alcohol abuse and dependence: Secondary | ICD-10-CM

## 2016-07-02 DIAGNOSIS — Z87891 Personal history of nicotine dependence: Secondary | ICD-10-CM

## 2016-07-02 DIAGNOSIS — Z888 Allergy status to other drugs, medicaments and biological substances status: Secondary | ICD-10-CM

## 2016-07-02 MED ORDER — GABAPENTIN 300 MG PO CAPS: 300.0000 mg | ORAL_CAPSULE | Freq: Three times a day (TID) | ORAL | 0 refills | Status: DC

## 2016-07-02 MED ORDER — HYDROXYZINE HCL 25 MG PO TABS: 25.0000 mg | ORAL_TABLET | Freq: Three times a day (TID) | ORAL | 0 refills | Status: DC

## 2016-07-02 MED ORDER — LAMOTRIGINE 200 MG PO TABS: 200.0000 mg | ORAL_TABLET | Freq: Two times a day (BID) | ORAL | 0 refills | Status: DC

## 2016-07-02 MED ORDER — VENLAFAXINE HCL 75 MG PO TABS: 75.0000 mg | ORAL_TABLET | Freq: Every day | ORAL | 0 refills | Status: DC
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
## 2016-07-22 IMAGING — CT CT L SPINE W/O CM
1 of 6 series · 7 of 14 positions shown, 9 images · non-contrast
Comparison: 05/10/2011

CLINICAL DATA: Patient complaining of low back pain and muscle
spasms in the arms and legs for 4 years. No known injuries. No
history of surgery.

EXAM:
CT LUMBAR SPINE WITHOUT CONTRAST
TECHNIQUE: Multidetector CT imaging of the lumbar spine was performed without
intravenous contrast administration. Multiplanar CT image
reconstructions were also generated.

[Series 3: l spine soft · axial · 0.30mm/px · z∈[-452,-276]mm · 7 of 118 slices shown, 9 images]
[im 15/118  soft-tissue]
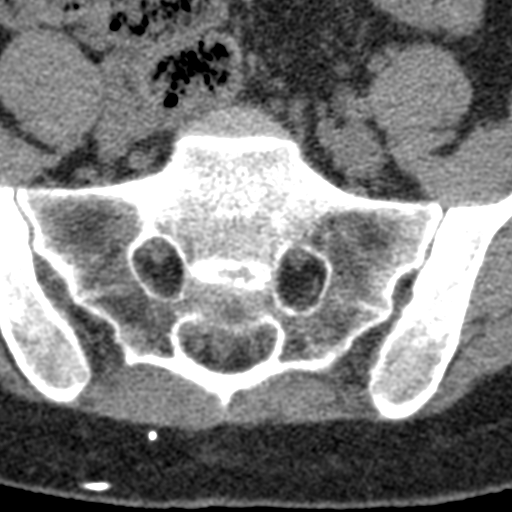
[im 15/118  bone]
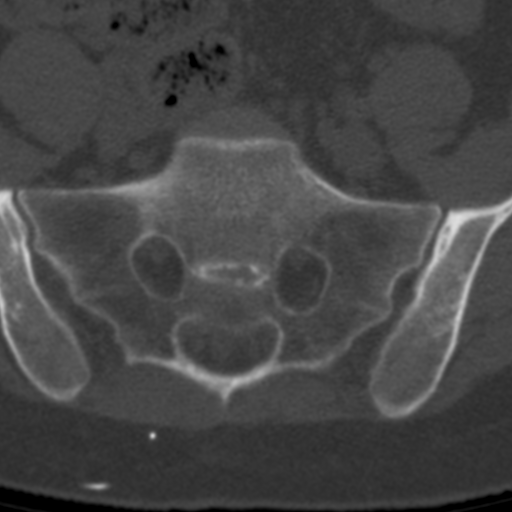
[im 30/118  bone]
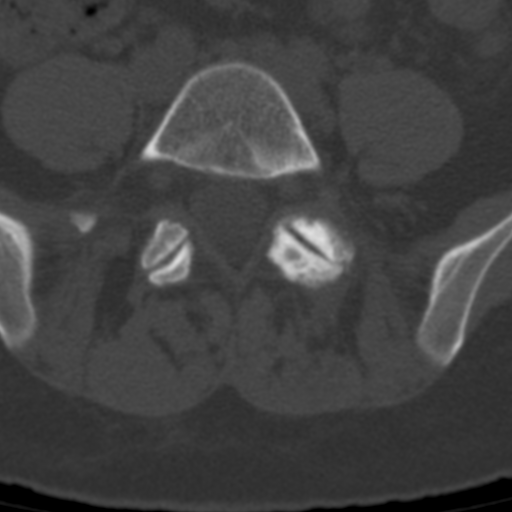
[im 44/118  bone]
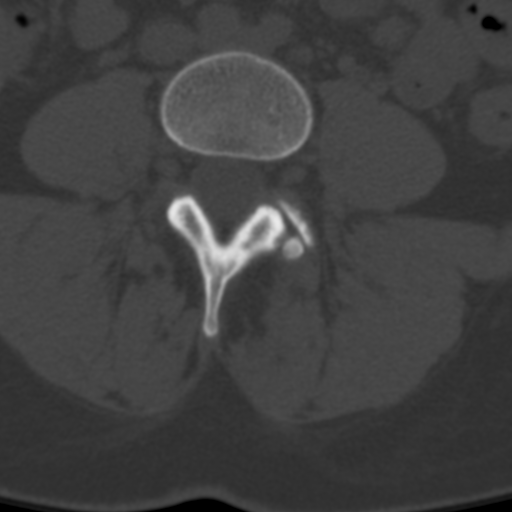
[im 59/118  bone]
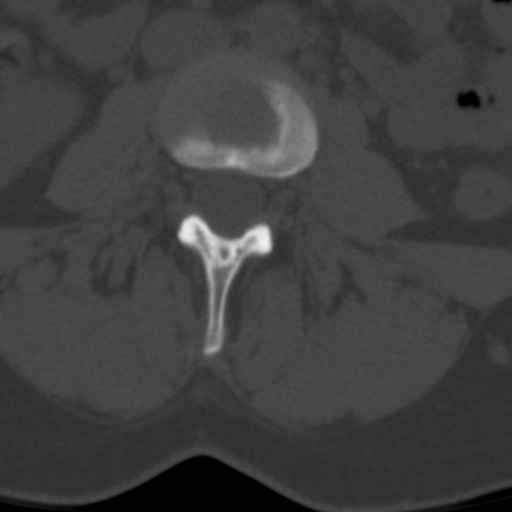
[im 74/118  soft-tissue]
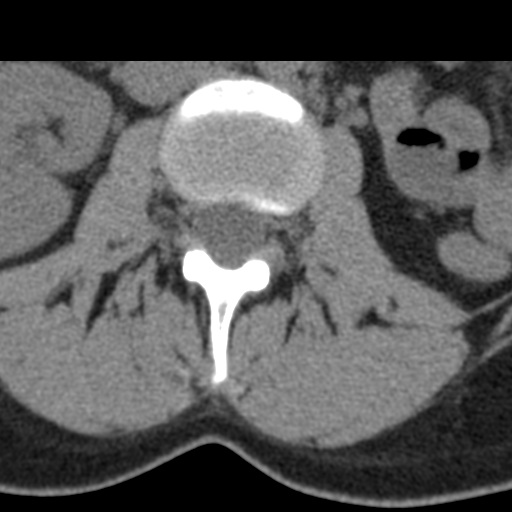
[im 74/118  bone]
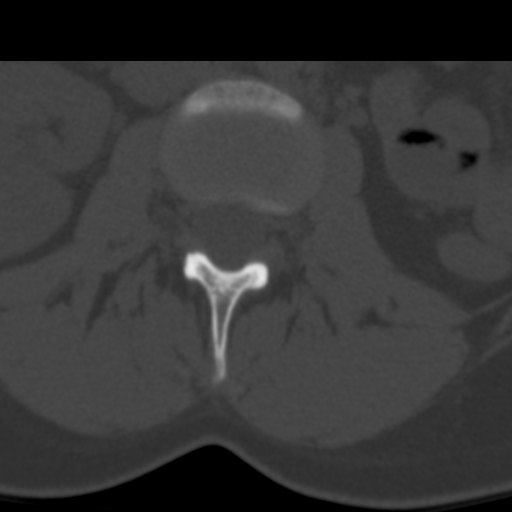
[im 88/118  bone]
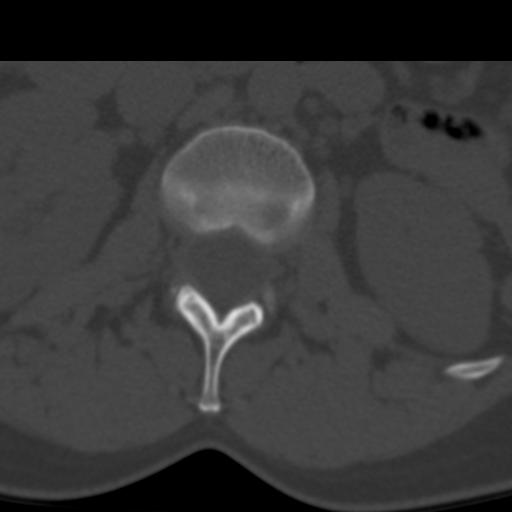
[im 103/118  bone]
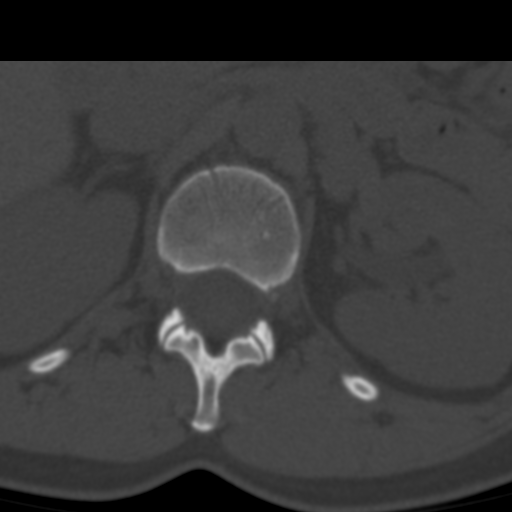

[7 of 14 positions shown; findings below may reference images not displayed]

FINDINGS: Normal vertebral body stature and alignment. There is chronic pars
defect on the right at L5-S1. The left L5-S1 pars interarticularis
appears mildly thickened and somewhat sclerotic which may be
compensatory. There is no left pars defect.

The disc spaces are well maintained. Mild facet joint narrowing and
sclerosis on the left at L5-S1. Remaining facet joints are well
preserved.

T12-L1: Normal.

L1-L2:  Normal.

L2-L3:  Normal.

L3-L4:  Normal.

L4-L5:  Normal.

L5-S1: No disc bulging or herniation. Central spinal canal, lateral
recesses and neural foramina are well preserved.

There is a catheter that enters right sacrum at the S3 level,
present previously, incompletely imaged. Soft tissues otherwise
unremarkable.
IMPRESSION: 1. Unilateral pars defect on the right at L5-S1, unchanged from the
prior CT. There is relative thickening of the left pars
interarticularis and mild left facet degenerative change.
2. No other abnormalities. No significant disc bulging, no disc
herniations and no stenosis. No evidence of nerve root impingement.

## 2016-08-19 ENCOUNTER — Encounter: Payer: Self-pay | Admitting: Advanced Practice Midwife

## 2016-08-19 ENCOUNTER — Ambulatory Visit (INDEPENDENT_AMBULATORY_CARE_PROVIDER_SITE_OTHER): Payer: Managed Care, Other (non HMO) | Admitting: Advanced Practice Midwife

## 2016-08-19 VITALS — BP 118/74 | Ht 69.0 in | Wt 162.0 lb

## 2016-08-19 DIAGNOSIS — Z124 Encounter for screening for malignant neoplasm of cervix: Secondary | ICD-10-CM

## 2016-08-19 DIAGNOSIS — Z0000 Encounter for general adult medical examination without abnormal findings: Secondary | ICD-10-CM | POA: Diagnosis not present

## 2016-08-19 DIAGNOSIS — Z01419 Encounter for gynecological examination (general) (routine) without abnormal findings: Secondary | ICD-10-CM
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
## 2016-08-20 LAB — CBC WITH DIFFERENTIAL/PLATELET
BASOS ABS: 0 10*3/uL (ref 0.0–0.2)
BASOS: 0 %
EOS (ABSOLUTE): 0.1 10*3/uL (ref 0.0–0.4)
Eos: 1 %
Hematocrit: 40.6 % (ref 34.0–46.6)
Hemoglobin: 13.7 g/dL (ref 11.1–15.9)
Immature Grans (Abs): 0 10*3/uL (ref 0.0–0.1)
Immature Granulocytes: 0 %
LYMPHS ABS: 2.3 10*3/uL (ref 0.7–3.1)
Lymphs: 37 %
MCH: 34.3 pg — AB (ref 26.6–33.0)
MCHC: 33.7 g/dL (ref 31.5–35.7)
MCV: 102 fL — AB (ref 79–97)
MONOS ABS: 0.5 10*3/uL (ref 0.1–0.9)
Monocytes: 8 %
NEUTROS ABS: 3.4 10*3/uL (ref 1.4–7.0)
Neutrophils: 54 %
PLATELETS: 339 10*3/uL (ref 150–379)
RBC: 4 x10E6/uL (ref 3.77–5.28)
RDW: 14.2 % (ref 12.3–15.4)
WBC: 6.2 10*3/uL (ref 3.4–10.8)

## 2016-08-20 LAB — TESTOSTERONE: Testosterone: 29 ng/dL (ref 8–48)

## 2016-08-20 LAB — ESTRADIOL: Estradiol: 43.8 pg/mL

## 2016-08-20 LAB — PROLACTIN: Prolactin: 8.9 ng/mL (ref 4.8–23.3)

## 2016-08-20 LAB — PROGESTERONE: PROGESTERONE: 0.1 ng/mL

## 2016-08-20 LAB — FSH/LH
FSH: 10.3 m[IU]/mL
LH: 8.9 m[IU]/mL

## 2016-08-21 LAB — PAP IG (IMAGE GUIDED): PAP Smear Comment: 0

## 2016-10-01 ENCOUNTER — Ambulatory Visit (INDEPENDENT_AMBULATORY_CARE_PROVIDER_SITE_OTHER): Payer: 59 | Admitting: Psychiatry

## 2016-10-01 ENCOUNTER — Encounter (HOSPITAL_COMMUNITY): Payer: Self-pay | Admitting: Psychiatry

## 2016-10-01 DIAGNOSIS — Z79899 Other long term (current) drug therapy: Secondary | ICD-10-CM | POA: Diagnosis not present

## 2016-10-01 DIAGNOSIS — Z87891 Personal history of nicotine dependence: Secondary | ICD-10-CM

## 2016-10-01 DIAGNOSIS — Z818 Family history of other mental and behavioral disorders: Secondary | ICD-10-CM | POA: Diagnosis not present

## 2016-10-01 DIAGNOSIS — Z811 Family history of alcohol abuse and dependence: Secondary | ICD-10-CM

## 2016-10-01 DIAGNOSIS — F319 Bipolar disorder, unspecified: Secondary | ICD-10-CM | POA: Diagnosis not present

## 2016-10-01 DIAGNOSIS — F3162 Bipolar disorder, current episode mixed, moderate: Secondary | ICD-10-CM

## 2016-10-01 MED ORDER — VENLAFAXINE HCL 75 MG PO TABS: 75.0000 mg | ORAL_TABLET | Freq: Every day | ORAL | 0 refills | Status: DC

## 2016-10-01 MED ORDER — GABAPENTIN 300 MG PO CAPS: 300.0000 mg | ORAL_CAPSULE | Freq: Three times a day (TID) | ORAL | 0 refills | Status: DC

## 2016-10-01 MED ORDER — HYDROXYZINE HCL 25 MG PO TABS: 25.0000 mg | ORAL_TABLET | Freq: Three times a day (TID) | ORAL | 0 refills | Status: DC

## 2016-10-01 MED ORDER — LAMOTRIGINE 200 MG PO TABS: 200.0000 mg | ORAL_TABLET | Freq: Two times a day (BID) | ORAL | 0 refills | Status: DC
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
## 2016-10-12 IMAGING — CR DG CHEST 1V PORT
1 series · 1 of 1 positions shown · non-contrast
Comparison: 03/07/2012.

CLINICAL DATA: Mid chest discomfort nausea and vomiting. Smoker.
Initial encounter.

EXAM:
PORTABLE CHEST 1 VIEW

[chest pa]
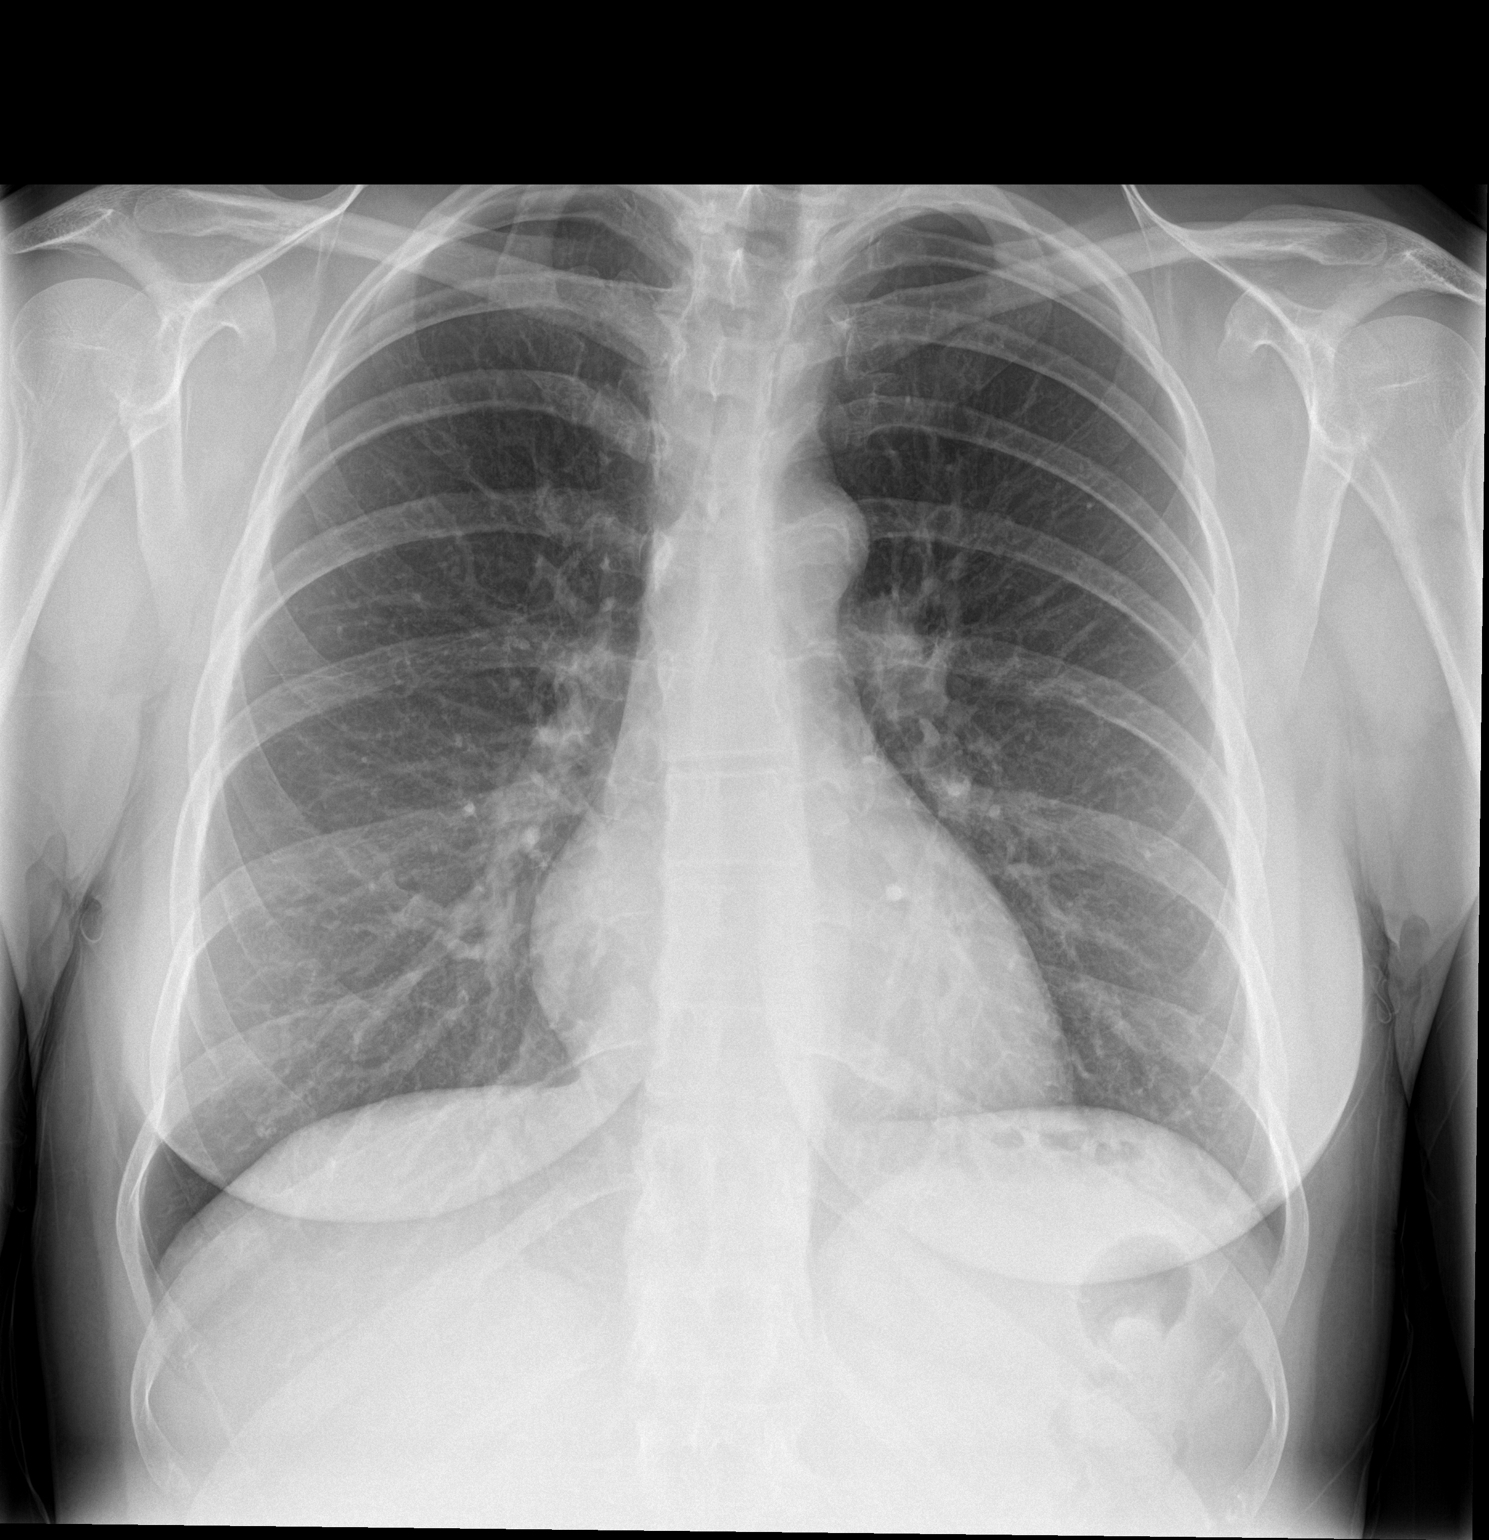

[1 of 1 positions shown; findings below may reference images not displayed]

FINDINGS: 5753 hours. Interpretation of this examination was delayed as it was
not made available for dictation in PACS until 9644 hours.

The heart size and mediastinal contours are normal. The lungs are
clear. There is no pleural effusion or pneumothorax. No acute
osseous findings are identified. There is a mild thoracic scoliosis
which appears unchanged.
IMPRESSION: Stable chest.  No active cardiopulmonary process.

## 2017-02-12 ENCOUNTER — Other Ambulatory Visit (HOSPITAL_COMMUNITY): Payer: Self-pay | Admitting: Psychiatry

## 2017-02-12 DIAGNOSIS — F3162 Bipolar disorder, current episode mixed, moderate: Secondary | ICD-10-CM

## 2017-02-12 DIAGNOSIS — F319 Bipolar disorder, unspecified: Secondary | ICD-10-CM

## 2017-02-14 ENCOUNTER — Telehealth (HOSPITAL_COMMUNITY): Payer: Self-pay

## 2017-02-14 NOTE — Telephone Encounter (Signed)
Medication refill requests - Faxes received from [facility] for refills of pt's Hydroxyzine, Lamictal and Neurontin, all last filled for 90 days 10/01/16 with no refills and pt. returns 04/03/17, 6 months as directed.

## 2017-02-17 ENCOUNTER — Other Ambulatory Visit (HOSPITAL_COMMUNITY): Payer: Self-pay

## 2017-02-17 NOTE — Telephone Encounter (Signed)
Okay to refill? 

## 2017-03-28 DIAGNOSIS — M5136 Other intervertebral disc degeneration, lumbar region: Secondary | ICD-10-CM

## 2017-03-28 DIAGNOSIS — M51369 Other intervertebral disc degeneration, lumbar region without mention of lumbar back pain or lower extremity pain: Secondary | ICD-10-CM | POA: Insufficient documentation

## 2017-03-28 DIAGNOSIS — G894 Chronic pain syndrome: Secondary | ICD-10-CM | POA: Insufficient documentation

## 2017-03-28 HISTORY — DX: Other intervertebral disc degeneration, lumbar region without mention of lumbar back pain or lower extremity pain: M51.369

## 2017-03-28 HISTORY — DX: Other intervertebral disc degeneration, lumbar region: M51.36

## 2017-04-03 ENCOUNTER — Ambulatory Visit (HOSPITAL_COMMUNITY): Payer: 59 | Admitting: Psychiatry

## 2017-04-03 ENCOUNTER — Encounter: Payer: Self-pay | Admitting: Advanced Practice Midwife

## 2017-04-03 ENCOUNTER — Encounter (HOSPITAL_COMMUNITY): Payer: Self-pay | Admitting: Psychiatry

## 2017-04-03 DIAGNOSIS — F319 Bipolar disorder, unspecified: Secondary | ICD-10-CM

## 2017-04-03 DIAGNOSIS — Z818 Family history of other mental and behavioral disorders: Secondary | ICD-10-CM

## 2017-04-03 DIAGNOSIS — Z79899 Other long term (current) drug therapy: Secondary | ICD-10-CM

## 2017-04-03 DIAGNOSIS — Z811 Family history of alcohol abuse and dependence: Secondary | ICD-10-CM

## 2017-04-03 DIAGNOSIS — Z915 Personal history of self-harm: Secondary | ICD-10-CM

## 2017-04-03 DIAGNOSIS — F3162 Bipolar disorder, current episode mixed, moderate: Secondary | ICD-10-CM | POA: Diagnosis not present

## 2017-04-03 DIAGNOSIS — F419 Anxiety disorder, unspecified: Secondary | ICD-10-CM

## 2017-04-03 MED ORDER — LAMOTRIGINE 200 MG PO TABS: 200.0000 mg | ORAL_TABLET | Freq: Two times a day (BID) | ORAL | 0 refills | Status: DC

## 2017-04-03 MED ORDER — HYDROXYZINE HCL 25 MG PO TABS
25.0000 mg | ORAL_TABLET | Freq: Three times a day (TID) | ORAL | 0 refills | Status: DC
Start: 2017-04-03 — End: 2017-06-20

## 2017-04-03 MED ORDER — VENLAFAXINE HCL 75 MG PO TABS: 75.0000 mg | ORAL_TABLET | Freq: Every day | ORAL | 0 refills | Status: DC

## 2017-04-03 MED ORDER — GABAPENTIN 300 MG PO CAPS: 300.0000 mg | ORAL_CAPSULE | Freq: Three times a day (TID) | ORAL | 0 refills | Status: DC
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
## 2017-06-11 ENCOUNTER — Other Ambulatory Visit (HOSPITAL_COMMUNITY): Payer: Self-pay | Admitting: Psychiatry

## 2017-06-11 DIAGNOSIS — F3162 Bipolar disorder, current episode mixed, moderate: Secondary | ICD-10-CM

## 2017-06-11 DIAGNOSIS — F319 Bipolar disorder, unspecified: Secondary | ICD-10-CM

## 2017-06-20 ENCOUNTER — Other Ambulatory Visit (HOSPITAL_COMMUNITY): Payer: Self-pay

## 2017-06-20 DIAGNOSIS — F3162 Bipolar disorder, current episode mixed, moderate: Secondary | ICD-10-CM

## 2017-06-20 DIAGNOSIS — F319 Bipolar disorder, unspecified: Secondary | ICD-10-CM

## 2017-06-20 MED ORDER — HYDROXYZINE HCL 25 MG PO TABS: 25.0000 mg | ORAL_TABLET | Freq: Three times a day (TID) | ORAL | 0 refills | Status: DC

## 2017-06-20 MED ORDER — LAMOTRIGINE 200 MG PO TABS: 200.0000 mg | ORAL_TABLET | Freq: Two times a day (BID) | ORAL | 0 refills | Status: DC

## 2017-06-20 MED ORDER — GABAPENTIN 300 MG PO CAPS: 300.0000 mg | ORAL_CAPSULE | Freq: Three times a day (TID) | ORAL | 0 refills | Status: DC

## 2017-06-20 MED ORDER — VENLAFAXINE HCL 75 MG PO TABS: 75.0000 mg | ORAL_TABLET | Freq: Every day | ORAL | 0 refills | Status: DC

## 2017-08-21 ENCOUNTER — Other Ambulatory Visit (HOSPITAL_COMMUNITY): Payer: Self-pay | Admitting: Psychiatry

## 2017-08-21 DIAGNOSIS — F319 Bipolar disorder, unspecified: Secondary | ICD-10-CM

## 2017-08-21 DIAGNOSIS — F3162 Bipolar disorder, current episode mixed, moderate: Secondary | ICD-10-CM

## 2017-08-25 NOTE — Telephone Encounter (Signed)
Received  refill request for Lamictal 1mg  and Neurontin 300mg  capsules. Patient next appointment is 10-01-17. Please advise

## 2017-09-17 ENCOUNTER — Other Ambulatory Visit (HOSPITAL_COMMUNITY): Payer: Self-pay | Admitting: Psychiatry

## 2017-09-25 ENCOUNTER — Other Ambulatory Visit: Payer: Self-pay | Admitting: Family Medicine

## 2017-10-01 ENCOUNTER — Ambulatory Visit (HOSPITAL_COMMUNITY): Payer: 59 | Admitting: Psychiatry

## 2017-10-01 ENCOUNTER — Encounter (HOSPITAL_COMMUNITY): Payer: Self-pay | Admitting: Psychiatry

## 2017-10-01 VITALS — BP 122/78 | HR 74 | Ht 68.5 in | Wt 173.6 lb

## 2017-10-01 DIAGNOSIS — F1121 Opioid dependence, in remission: Secondary | ICD-10-CM

## 2017-10-01 DIAGNOSIS — F17210 Nicotine dependence, cigarettes, uncomplicated: Secondary | ICD-10-CM

## 2017-10-01 DIAGNOSIS — F411 Generalized anxiety disorder: Secondary | ICD-10-CM | POA: Diagnosis not present

## 2017-10-01 DIAGNOSIS — F3162 Bipolar disorder, current episode mixed, moderate: Secondary | ICD-10-CM | POA: Diagnosis not present

## 2017-10-01 DIAGNOSIS — M549 Dorsalgia, unspecified: Secondary | ICD-10-CM | POA: Diagnosis not present

## 2017-10-01 DIAGNOSIS — Z811 Family history of alcohol abuse and dependence: Secondary | ICD-10-CM | POA: Diagnosis not present

## 2017-10-01 DIAGNOSIS — Z818 Family history of other mental and behavioral disorders: Secondary | ICD-10-CM

## 2017-10-01 DIAGNOSIS — F319 Bipolar disorder, unspecified: Secondary | ICD-10-CM

## 2017-10-01 MED ORDER — VENLAFAXINE HCL 75 MG PO TABS: 75.0000 mg | ORAL_TABLET | Freq: Every day | ORAL | 0 refills | Status: DC

## 2017-10-01 MED ORDER — LAMOTRIGINE 200 MG PO TABS
200.0000 mg | ORAL_TABLET | Freq: Two times a day (BID) | ORAL | 1 refills | Status: DC
Start: 2017-10-01 — End: 2018-04-01

## 2017-10-01 MED ORDER — HYDROXYZINE HCL 25 MG PO TABS: 25.0000 mg | ORAL_TABLET | Freq: Two times a day (BID) | ORAL | 1 refills | Status: DC | PRN
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
## 2017-11-18 ENCOUNTER — Other Ambulatory Visit (HOSPITAL_COMMUNITY): Payer: Self-pay | Admitting: Psychiatry

## 2017-11-18 DIAGNOSIS — F3162 Bipolar disorder, current episode mixed, moderate: Secondary | ICD-10-CM

## 2017-11-18 DIAGNOSIS — F319 Bipolar disorder, unspecified: Secondary | ICD-10-CM

## 2017-11-19 NOTE — Telephone Encounter (Signed)
Patient called requesting a 30 day refill of the Gabapentin, and the Venlafaxine. Please advise

## 2017-12-03 ENCOUNTER — Other Ambulatory Visit: Payer: Self-pay | Admitting: Family Medicine

## 2017-12-03 DIAGNOSIS — R928 Other abnormal and inconclusive findings on diagnostic imaging of breast: Secondary | ICD-10-CM

## 2017-12-04 ENCOUNTER — Other Ambulatory Visit (HOSPITAL_COMMUNITY): Payer: Self-pay

## 2017-12-04 ENCOUNTER — Other Ambulatory Visit (HOSPITAL_COMMUNITY): Payer: Self-pay | Admitting: Psychiatry

## 2017-12-04 DIAGNOSIS — F319 Bipolar disorder, unspecified: Secondary | ICD-10-CM

## 2017-12-04 DIAGNOSIS — F3162 Bipolar disorder, current episode mixed, moderate: Secondary | ICD-10-CM

## 2017-12-04 MED ORDER — VENLAFAXINE HCL 75 MG PO TABS: 75.0000 mg | ORAL_TABLET | Freq: Every day | ORAL | 0 refills | Status: DC

## 2017-12-04 MED ORDER — GABAPENTIN 300 MG PO CAPS: 300.0000 mg | ORAL_CAPSULE | Freq: Three times a day (TID) | ORAL | 0 refills | Status: DC

## 2017-12-11 ENCOUNTER — Other Ambulatory Visit: Payer: Self-pay | Admitting: Nurse Practitioner

## 2017-12-12 ENCOUNTER — Ambulatory Visit
Admission: RE | Admit: 2017-12-12 | Discharge: 2017-12-12 | Disposition: A | Discharge status: Home / Self Care | Payer: Managed Care, Other (non HMO) | Source: Ambulatory Visit | Attending: Family Medicine | Admitting: Family Medicine

## 2017-12-12 ENCOUNTER — Other Ambulatory Visit: Payer: Self-pay | Admitting: Family Medicine

## 2017-12-12 DIAGNOSIS — R928 Other abnormal and inconclusive findings on diagnostic imaging of breast: Secondary | ICD-10-CM

## 2017-12-15 ENCOUNTER — Other Ambulatory Visit: Payer: Self-pay

## 2018-01-06 ENCOUNTER — Other Ambulatory Visit (HOSPITAL_COMMUNITY): Payer: Self-pay

## 2018-01-06 DIAGNOSIS — F3162 Bipolar disorder, current episode mixed, moderate: Secondary | ICD-10-CM

## 2018-01-06 DIAGNOSIS — F319 Bipolar disorder, unspecified: Secondary | ICD-10-CM

## 2018-01-06 MED ORDER — GABAPENTIN 300 MG PO CAPS: 300.0000 mg | ORAL_CAPSULE | Freq: Three times a day (TID) | ORAL | 0 refills | Status: DC

## 2018-02-20 ENCOUNTER — Other Ambulatory Visit (HOSPITAL_COMMUNITY): Payer: Self-pay | Admitting: Psychiatry

## 2018-02-20 DIAGNOSIS — F3162 Bipolar disorder, current episode mixed, moderate: Secondary | ICD-10-CM

## 2018-03-16 ENCOUNTER — Other Ambulatory Visit (HOSPITAL_COMMUNITY): Payer: Self-pay | Admitting: Psychiatry

## 2018-03-16 DIAGNOSIS — F3162 Bipolar disorder, current episode mixed, moderate: Secondary | ICD-10-CM

## 2018-03-16 DIAGNOSIS — F319 Bipolar disorder, unspecified: Secondary | ICD-10-CM

## 2018-03-18 ENCOUNTER — Other Ambulatory Visit (HOSPITAL_COMMUNITY): Payer: Self-pay

## 2018-03-18 DIAGNOSIS — F319 Bipolar disorder, unspecified: Secondary | ICD-10-CM

## 2018-03-18 DIAGNOSIS — F3162 Bipolar disorder, current episode mixed, moderate: Secondary | ICD-10-CM

## 2018-03-18 MED ORDER — GABAPENTIN 300 MG PO CAPS: 300.0000 mg | ORAL_CAPSULE | Freq: Three times a day (TID) | ORAL | 0 refills | Status: DC

## 2018-04-01 ENCOUNTER — Ambulatory Visit (INDEPENDENT_AMBULATORY_CARE_PROVIDER_SITE_OTHER): Payer: 59 | Admitting: Psychiatry

## 2018-04-01 ENCOUNTER — Encounter (HOSPITAL_COMMUNITY): Payer: Self-pay | Admitting: Psychiatry

## 2018-04-01 VITALS — BP 136/84 | HR 92 | Ht 68.0 in | Wt 170.0 lb

## 2018-04-01 DIAGNOSIS — F411 Generalized anxiety disorder: Secondary | ICD-10-CM

## 2018-04-01 DIAGNOSIS — F319 Bipolar disorder, unspecified: Secondary | ICD-10-CM | POA: Diagnosis not present

## 2018-04-01 MED ORDER — GABAPENTIN 300 MG PO CAPS: 300.0000 mg | ORAL_CAPSULE | Freq: Three times a day (TID) | ORAL | 0 refills | Status: DC

## 2018-04-01 MED ORDER — VORTIOXETINE HBR 10 MG PO TABS: 10.0000 mg | ORAL_TABLET | Freq: Every day | ORAL | 1 refills | Status: DC

## 2018-04-01 MED ORDER — LAMOTRIGINE 200 MG PO TABS: 200.0000 mg | ORAL_TABLET | Freq: Two times a day (BID) | ORAL | 1 refills | Status: AC
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
## 2018-04-06 ENCOUNTER — Ambulatory Visit (HOSPITAL_COMMUNITY): Payer: Self-pay | Admitting: Psychiatry

## 2018-04-29 ENCOUNTER — Other Ambulatory Visit (HOSPITAL_COMMUNITY): Payer: Self-pay | Admitting: Psychiatry

## 2018-05-15 ENCOUNTER — Other Ambulatory Visit (HOSPITAL_COMMUNITY): Payer: Self-pay

## 2018-05-15 ENCOUNTER — Other Ambulatory Visit (HOSPITAL_COMMUNITY): Payer: Self-pay | Admitting: Psychiatry

## 2018-05-15 DIAGNOSIS — F411 Generalized anxiety disorder: Secondary | ICD-10-CM

## 2018-05-15 DIAGNOSIS — F319 Bipolar disorder, unspecified: Secondary | ICD-10-CM

## 2018-05-15 MED ORDER — VORTIOXETINE HBR 10 MG PO TABS: 10.0000 mg | ORAL_TABLET | Freq: Every day | ORAL | 0 refills | Status: DC

## 2018-05-19 ENCOUNTER — Other Ambulatory Visit (HOSPITAL_COMMUNITY): Payer: Self-pay

## 2018-05-19 DIAGNOSIS — F411 Generalized anxiety disorder: Secondary | ICD-10-CM

## 2018-05-19 DIAGNOSIS — F319 Bipolar disorder, unspecified: Secondary | ICD-10-CM

## 2018-05-19 MED ORDER — GABAPENTIN 300 MG PO CAPS: 300.0000 mg | ORAL_CAPSULE | Freq: Three times a day (TID) | ORAL | 0 refills | Status: DC

## 2018-05-26 ENCOUNTER — Other Ambulatory Visit (HOSPITAL_COMMUNITY): Payer: Self-pay

## 2018-05-26 DIAGNOSIS — F319 Bipolar disorder, unspecified: Secondary | ICD-10-CM

## 2018-05-26 DIAGNOSIS — F411 Generalized anxiety disorder: Secondary | ICD-10-CM

## 2018-05-26 MED ORDER — VORTIOXETINE HBR 10 MG PO TABS: 10.0000 mg | ORAL_TABLET | Freq: Every day | ORAL | 0 refills | Status: DC

## 2018-06-29 ENCOUNTER — Ambulatory Visit (HOSPITAL_COMMUNITY): Payer: Self-pay | Admitting: Psychiatry

## 2018-07-08 ENCOUNTER — Other Ambulatory Visit (HOSPITAL_COMMUNITY): Payer: Self-pay

## 2018-07-08 DIAGNOSIS — F319 Bipolar disorder, unspecified: Secondary | ICD-10-CM

## 2018-07-08 DIAGNOSIS — F411 Generalized anxiety disorder: Secondary | ICD-10-CM

## 2018-07-08 MED ORDER — GABAPENTIN 300 MG PO CAPS: 300.0000 mg | ORAL_CAPSULE | Freq: Three times a day (TID) | ORAL | 0 refills | Status: AC

## 2018-07-15 ENCOUNTER — Other Ambulatory Visit (HOSPITAL_COMMUNITY): Payer: Self-pay

## 2018-07-15 DIAGNOSIS — F411 Generalized anxiety disorder: Secondary | ICD-10-CM

## 2018-07-15 MED ORDER — HYDROXYZINE HCL 25 MG PO TABS: 25.0000 mg | ORAL_TABLET | Freq: Two times a day (BID) | ORAL | 1 refills | Status: AC | PRN

## 2018-07-22 ENCOUNTER — Ambulatory Visit (HOSPITAL_COMMUNITY): Payer: Self-pay | Admitting: Psychiatry

## 2022-03-06 ENCOUNTER — Ambulatory Visit
Admission: EM | Admit: 2022-03-06 | Discharge: 2022-03-06 | Disposition: A | Discharge status: Home / Self Care | Payer: Managed Care, Other (non HMO) | Attending: Emergency Medicine | Admitting: Emergency Medicine

## 2022-03-06 ENCOUNTER — Encounter: Payer: Self-pay | Admitting: Emergency Medicine

## 2022-03-06 ENCOUNTER — Emergency Department: Payer: Managed Care, Other (non HMO) | Admitting: Anesthesiology

## 2022-03-06 ENCOUNTER — Encounter
Admission: EM | Disposition: A | Discharge status: Home / Self Care | Payer: Self-pay | Source: Home / Self Care | Attending: Emergency Medicine

## 2022-03-06 ENCOUNTER — Other Ambulatory Visit: Payer: Self-pay

## 2022-03-06 DIAGNOSIS — X58XXXA Exposure to other specified factors, initial encounter: Secondary | ICD-10-CM | POA: Diagnosis not present

## 2022-03-06 DIAGNOSIS — T18128A Food in esophagus causing other injury, initial encounter: Secondary | ICD-10-CM | POA: Diagnosis present

## 2022-03-06 DIAGNOSIS — W44F3XA Food entering into or through a natural orifice, initial encounter: Secondary | ICD-10-CM

## 2022-03-06 DIAGNOSIS — F17210 Nicotine dependence, cigarettes, uncomplicated: Secondary | ICD-10-CM | POA: Diagnosis not present

## 2022-03-06 DIAGNOSIS — K209 Esophagitis, unspecified without bleeding: Secondary | ICD-10-CM | POA: Diagnosis not present

## 2022-03-06 HISTORY — PX: ESOPHAGOGASTRODUODENOSCOPY: SHX5428

## 2022-03-06 LAB — PREGNANCY, URINE: Preg Test, Ur: NEGATIVE

## 2022-03-06 LAB — POC URINE PREG, ED: Preg Test, Ur: NEGATIVE

## 2022-03-06 SURGERY — EGD (ESOPHAGOGASTRODUODENOSCOPY)
Anesthesia: General

## 2022-03-06 MED ORDER — GLUCAGON HCL RDNA (DIAGNOSTIC) 1 MG IJ SOLR
1.0000 mg | Freq: Once | INTRAMUSCULAR | Status: AC
  Administered 2022-03-06: 1 mg via INTRAVENOUS
  Filled 2022-03-06: qty 1

## 2022-03-06 MED ORDER — PROPOFOL 10 MG/ML IV BOLUS
INTRAVENOUS | Status: DC | PRN
  Administered 2022-03-06: 40 mg via INTRAVENOUS
  Administered 2022-03-06: 160 mg via INTRAVENOUS
  Administered 2022-03-06: 50 mg via INTRAVENOUS

## 2022-03-06 MED ORDER — SODIUM CHLORIDE 0.9 % IV SOLN: INTRAVENOUS | Status: DC

## 2022-03-06 MED ORDER — LIDOCAINE HCL (CARDIAC) PF 100 MG/5ML IV SOSY
PREFILLED_SYRINGE | INTRAVENOUS | Status: DC | PRN
  Administered 2022-03-06: 80 mg via INTRAVENOUS

## 2022-03-06 MED ORDER — SODIUM CHLORIDE 0.9 % IV SOLN
INTRAVENOUS | Status: DC
  Administered 2022-03-06: 10 mL/h via INTRAVENOUS

## 2022-03-06 MED ORDER — SUCCINYLCHOLINE CHLORIDE 200 MG/10ML IV SOSY
PREFILLED_SYRINGE | INTRAVENOUS | Status: DC | PRN
  Administered 2022-03-06: 100 mg via INTRAVENOUS
  Administered 2022-03-06: 40 mg via INTRAVENOUS

## 2022-03-06 MED ORDER — ALBUTEROL SULFATE HFA 108 (90 BASE) MCG/ACT IN AERS
INHALATION_SPRAY | RESPIRATORY_TRACT | Status: DC | PRN
  Administered 2022-03-06: 6 via RESPIRATORY_TRACT

## 2022-03-06 NOTE — Transfer of Care (Signed)
Immediate Anesthesia Transfer of Care Note  Patient: Jeanette Bell  Procedure(s) Performed: ESOPHAGOGASTRODUODENOSCOPY (EGD)  Patient Location: PACU  Anesthesia Type:General  Level of Consciousness: sedated  Airway & Oxygen Therapy: Patient Spontanous Breathing  Post-op Assessment: Report given to RN and Post -op Vital signs reviewed and stable  Post vital signs: Reviewed and stable  Last Vitals:  Vitals Value Taken Time  BP 129/80 03/06/22 1204  Temp 36.7 C 03/06/22 1204  Pulse 86 03/06/22 1204  Resp 25 03/06/22 1204  SpO2 100 % 03/06/22 1204    Last Pain:  Vitals:   03/06/22 1204  TempSrc: Temporal  PainSc: Asleep         Complications: No notable events documented.

## 2022-03-06 NOTE — ED Triage Notes (Signed)
Patient ambulatory to triage with steady gait, without difficulty or distress noted; pt reports after eating steak yesterday, feels as if a piece is lodged; st unable to swallow since; hx of same with endoscopic removal

## 2022-03-06 NOTE — Anesthesia Procedure Notes (Signed)
Procedure Name: Intubation Date/Time: 03/06/2022 11:38 AM  Performed by: Letitia Neri, CRNAPre-anesthesia Checklist: Patient identified, Patient being monitored, Timeout performed, Emergency Drugs available and Suction available Patient Re-evaluated:Patient Re-evaluated prior to induction Oxygen Delivery Method: Circle system utilized Preoxygenation: Pre-oxygenation with 100% oxygen Induction Type: IV induction Laryngoscope Size: 3 and McGraph Grade View: Grade I Tube type: Oral Tube size: 6.5 mm Number of attempts: 1 Placement Confirmation: ETT inserted through vocal cords under direct vision, positive ETCO2 and breath sounds checked- equal and bilateral Secured at: 21 cm Tube secured with: Tape Dental Injury: Teeth and Oropharynx as per pre-operative assessment

## 2022-03-06 NOTE — Anesthesia Postprocedure Evaluation (Signed)
Anesthesia Post Note  Patient: Jeanette Bell  Procedure(s) Performed: ESOPHAGOGASTRODUODENOSCOPY (EGD)  Patient location during evaluation: PACU Anesthesia Type: General Level of consciousness: awake and alert Pain management: pain level controlled Vital Signs Assessment: post-procedure vital signs reviewed and stable Respiratory status: spontaneous breathing, nonlabored ventilation, respiratory function stable and patient connected to nasal cannula oxygen Cardiovascular status: blood pressure returned to baseline and stable Postop Assessment: no apparent nausea or vomiting Anesthetic complications: no   No notable events documented.   Last Vitals:  Vitals:   03/06/22 1204 03/06/22 1214  BP: 129/80 121/66  Pulse: 86 78  Resp: (!) 25 20  Temp: 36.7 C   SpO2: 100% 100%    Last Pain:  Vitals:   03/06/22 1214  TempSrc:   PainSc: 0-No pain                 Molli Barrows

## 2022-03-06 NOTE — Consult Note (Signed)
GI Inpatient Consult Note  Reason for Consult: Esophageal food obstruction 2/2 food impaction    Attending Requesting Consult: Dr. Artis Delay, MD  History of Present Illness: Jeanette Bell is a 51 y.o. female seen for evaluation of esophageal food obstruction 2/2 food impaction at the request of ED physician - Dr. Artis Delay. Patient has a PMH of Bipolar I disorder, GAD, lumbar DDD, and previous hx of food impaction requiring endoscopic removal. She presented to the [facility] ED early this morning for concerns of a food impaction. She reports she was eating steak last night and felt like a piece got lodged in her esophagus. She reports this has happened previously where she has had to have 2 endoscopies to remove food boluses. Last time this occurred was 02/2015. Last time she had her esophagus dilated was ~10 years ago. She was seen by me as outpatient back in 2019 for solid food dysphagia and advised to have EGD with dilatation, but she never followed through with the procedure. She reports for at least the past 8-9 years she has had a lot of difficulty with solid food, especially meat and breads. She really tries to focus on cutting up her food into small bites and chewing well. Last night she wasn't as careful and reports this episode feels very consistent with previous episodes. She has not been able to tolerate any oral intake since the piece of steak got lodged. She has tried to drink water but it immediately comes back up. She feels something is sitting at level of her sternal notch. She endorses some chest tightness and pressure, but no chest pain. She has no difficulties swallowing her own secretions. She does endorse intermittent GERD symptoms at home but doesn't take any daily acid suppression medication. Upon presentation to the ED this morning, vital signs were stable. Glucagon was not successful in helping to alleviate the food bolus. She denies any loss of appetite or unintentional weight  loss.    Past Medical History:  Past Medical History:  Diagnosis Date   Bipolar 1 disorder, depressed (HCC) 03/18/2012   Chronic hip pain    left hip. treated with radiofrequency   Depression    Lumbar degenerative disc disease 03/28/2017    Problem List: Patient Active Problem List   Diagnosis Date Noted   Chronic pain disorder 03/28/2017   Lumbar degenerative disc disease 03/28/2017   Other and unspecified alcohol dependence, unspecified drinking behavior 03/31/2012   Opioid type dependence, unspecified 03/31/2012   Bipolar 1 disorder, depressed (HCC) 03/18/2012    Past Surgical History: Past Surgical History:  Procedure Laterality Date   ABDOMINAL HYSTERECTOMY     uterine ablatione (removal of lining of uterus)   BACK SURGERY     c cection     ESOPHAGOGASTRODUODENOSCOPY N/A 03/19/2015   Procedure: ESOPHAGOGASTRODUODENOSCOPY (EGD);  Surgeon: Wallace Cullens, MD;  Location: [facility] ENDOSCOPY;  Service: Gastroenterology;  Laterality: N/A;   NO PAST SURGERIES      Allergies: Allergies  Allergen Reactions   Ace Inhibitors Other (See Comments)    Unknown reaction. 03/19/2015 pt states she is NOT allergic to ACE inhibitors    Home Medications: (Not in a hospital admission)  Home medication reconciliation was completed with the patient.   Scheduled Inpatient Medications:    Continuous Inpatient Infusions:    PRN Inpatient Medications:    Family History: family history includes Alcohol abuse in her father; Bipolar disorder in her sister; Depression in her maternal aunt, maternal  grandmother, and mother.  The patient's family history is negative for inflammatory bowel disorders, GI malignancy, or solid organ transplantation.  Social History:   reports that she has been smoking cigarettes. She has been smoking an average of 1 pack per day. She has never used smokeless tobacco. She reports current alcohol use of about 8.0 standard drinks of alcohol per week. She reports that she  does not use drugs. The patient denies ETOH, tobacco, or drug use.   Review of Systems: Constitutional: Weight is stable.  Eyes: No changes in vision. ENT: No oral lesions, sore throat.  GI: see HPI.  Heme/Lymph: No easy bruising.  CV: No chest pain.  GU: No hematuria.  Integumentary: No rashes.  Neuro: No headaches.  Psych: No depression/anxiety.  Endocrine: No heat/cold intolerance.  Allergic/Immunologic: No urticaria.  Resp: No cough, SOB.  Musculoskeletal: No joint swelling.    Physical Examination: BP 123/72 (BP Location: Right Arm)   Pulse 64   Temp 98.6 F (37 C) (Oral)   Resp 20   Ht 5\' 7"  (1.702 m)   Wt 77.1 kg   SpO2 100%   BMI 26.63 kg/m  Gen: NAD, alert and oriented x 4, appears uncomfortable  HEENT: PEERLA, EOMI, Neck: supple, no JVD or thyromegaly Chest: CTA bilaterally, no wheezes, crackles, or other adventitious sounds CV: RRR, no m/g/c/r Abd: soft, NT, ND, +BS in all four quadrants; no HSM, guarding, ridigity, or rebound tenderness Ext: no edema, well perfused with 2+ pulses, Skin: no rash or lesions noted Lymph: no LAD  Data: Lab Results  Component Value Date   WBC 6.2 08/19/2016   HGB 13.7 08/19/2016   HCT 40.6 08/19/2016   MCV 102 (H) 08/19/2016   PLT 339 08/19/2016   No results for input(s): "HGB" in the last 168 hours. Lab Results  Component Value Date   NA 140 03/19/2015   K 3.8 03/19/2015   CL 106 03/19/2015   CO2 25 03/19/2015   BUN 8 03/19/2015   CREATININE 0.64 03/19/2015   Lab Results  Component Value Date   ALT 14 03/19/2015   AST 17 03/19/2015   ALKPHOS 86 03/19/2015   BILITOT 0.4 03/19/2015   No results for input(s): "APTT", "INR", "PTT" in the last 168 hours.  Assessment/Plan:  51 y/o Caucasian female with a PMH of Bipolar I disorder, GAD, lumbar DDD, and previous hx of food impaction requiring endoscopic removal presented to the [facility] ED this morning for concerns of food impaction  Esophageal food obstruction 2/2  food impaction Hx of esophageal stricture s/p dilatation   Clinical presentation highly c/w food impaction. She is clinically stable with normal vital signs and no acute respiratory distress. She is able to tolerate her own secretions without any difficulties. We reviewed recommendation to have emergent EGD +/- dilatation with food bolus removal this morning with Dr. Alice Reichert. We reviewed procedure details and indications in ED room today. She consents to proceed. EGD will be performed this morning by Dr. Alice Reichert. See procedure note for findings and further recommendations.   I reviewed the risks (including bleeding, perforation, infection, anesthesia complications, cardiac/respiratory complications), benefits and alternatives of EGD. Patient consents to proceed.    Thank you for the consult. Please call with questions or concerns.  Geanie Kenning, PA-C [facility] Gastroenterology [phone]

## 2022-03-06 NOTE — Op Note (Signed)
[facility] Gastroenterology Patient Name: Jeanette Bell Procedure Date: 03/06/2022 11:27 AM MRN: [id number] Account #: [id number] Date of Birth: 1971-03-25 Admit Type: Emergency Department Age: 51 Room: [facility] ENDO ROOM 2 Gender: Female Note Status: Finalized Instrument Name: Laurette Schimke [id number] Procedure:             Upper GI endoscopy Indications:           Removal of foreign body in the esophagus Providers:             Royce Macadamia K. Norma Fredrickson MD, MD Referring MD:          Thomasene Mohair Medicines:             General Anesthesia Complications:         No immediate complications. Estimated blood loss: None. Procedure:             Pre-Anesthesia Assessment:                        - The risks and benefits of the procedure and the                         sedation options and risks were discussed with the                         patient. All questions were answered and informed                         consent was obtained.                        - Patient identification and proposed procedure were                         verified prior to the procedure by the nurse. The                         procedure was verified in the procedure room.                        - ASA Grade Assessment: III - A patient with severe                         systemic disease.                        - After reviewing the risks and benefits, the patient                         was deemed in satisfactory condition to undergo the                         procedure.                        After obtaining informed consent, the endoscope was                         passed under direct vision. Throughout the procedure,  the patient's blood pressure, pulse, and oxygen                         saturations were monitored continuously. The Endoscope                         was introduced through the mouth, and advanced to the                         third part of duodenum. The  patient tolerated the                         procedure well. The upper GI endoscopy was somewhat                         difficult due to presence of foreign body. Successful                         completion of the procedure was aided by performing                         the maneuvers documented (below) in this report. The                         patient tolerated the procedure well. Findings:      Food was found in the distal esophagus. Removal was accomplished with a       tip of endoscope pushed food into gastric cavity. Estimated blood loss:       none.      Mildly severe esophagitis with no bleeding was found in the distal       esophagus compatible with pressure injury from foreign body (now       cleared).      There is no endoscopic evidence of stenosis, stricture or ulcerations in       the lower third of the esophagus.      Bilious fluid was found in the gastric body.      The examined duodenum was normal.      The exam was otherwise without abnormality. Impression:            - Food was found in the esophagus. Removal was                         successful.                        - Mildly severe esophagitis with no bleeding.                        - Bilious gastric fluid.                        - Normal examined duodenum.                        - The examination was otherwise normal. Recommendation:        - Patient has a contact number available for  emergencies. The signs and symptoms of potential                         delayed complications were discussed with the patient.                         Return to normal activities tomorrow. Written                         discharge instructions were provided to the patient.                        - Mechanical soft diet.                        - Continue present medications.                        - Avoid steak and hard meats, vegetables, breads given                         poor dentition and likely  poor ability to chew hard                         foods.                        - Return to GI office PRN.                        - The findings and recommendations were discussed with                         the patient. Procedure Code(s):     --- Professional ---                        [phone], Esophagogastroduodenoscopy, flexible,                         transoral; with removal of foreign body(s) Diagnosis Code(s):     --- Professional ---                        T18.108A, Unspecified foreign body in esophagus                         causing other injury, initial encounter                        K20.90, Esophagitis, unspecified without bleeding                        T18.128A, Food in esophagus causing other injury,                         initial encounter CPT copyright 2019 American Medical Association. All rights reserved. The codes documented in this report are preliminary and upon coder review may  be revised to meet current compliance requirements. Stanton Kidney MD, MD 03/06/2022 12:02:33 PM This report has been signed electronically. Number of Addenda: 0 Note Initiated On: 03/06/2022 11:27 AM  Estimated Blood Loss:  Estimated blood loss: none.      [facility]

## 2022-03-06 NOTE — ED Provider Notes (Signed)
   [facility] Provider Note    Event Date/Time   First MD Initiated Contact with Patient 03/06/22 [phone]     (approximate)   History   Foreign Body   HPI  RHESA FORSBERG is a 51 y.o. female with history of esophageal stricture who comes in with concerns for food impaction.  Patient reports she had to have her esophagus stretched in the past.  She reports that she has had 2 episodes and need to have endoscopy due to food impaction-she reports that she has not had her esophagus stretched for a very long time that she was eating some steak and she felt to get stuck in her throat last night around 7:30 PM.  She has not been able to tolerate eating or drinking anything since then.  She reports discomfort in her throat and reports this feels very similar to when she has had this in the past.  Physical Exam   Triage Vital Signs: ED Triage Vitals  Enc Vitals Group     BP 03/06/22 0648 123/72     Pulse Rate 03/06/22 0648 64     Resp 03/06/22 0648 20     Temp 03/06/22 0648 98.6 F (37 C)     Temp Source 03/06/22 0648 Oral     SpO2 03/06/22 0648 100 %     Weight 03/06/22 0646 170 lb (77.1 kg)     Height 03/06/22 0646 5\' 7"  (1.702 m)     Head Circumference --      Peak Flow --      Pain Score 03/06/22 0646 9     Pain Loc --      Pain Edu? --      Excl. in [place]? --     Most recent vital signs: Vitals:   03/06/22 0648  BP: 123/72  Pulse: 64  Resp: 20  Temp: 98.6 F (37 C)  SpO2: 100%     General: Awake, no distress.  CV:  Good peripheral perfusion.  Resp:  Normal effort.  Abd:  No distention.  Other:  Speaking in full sentences.  No drooling noted   ED Results / Procedures / Treatments   Labs (all labs ordered are listed, but only abnormal results are displayed) Labs Reviewed - No data to display  PROCEDURES:  Critical Care performed: No  Procedures   MEDICATIONS ORDERED IN ED: Medications  glucagon (human recombinant) (GLUCAGEN)  injection 1 mg (1 mg Intravenous Given 03/06/22 0731)     IMPRESSION / MDM / [place] / ED COURSE  I reviewed the triage vital signs and the nursing notes.   Patient's presentation is most consistent with acute, uncomplicated illness.   I reviewed patient's notes where she was seen in October 2016 for food impaction.  I reviewed patient's labs from 01/2022 with normal hemoglobin.  CMP normal creatinine  We will trial a dose of glucagon and let Dr. Alice Reichert from GI know about probably endoscopy   Unable to pass a trial of glucagon therefore patient be taken to GI for endoscopy     FINAL CLINICAL IMPRESSION(S) / ED DIAGNOSES   Final diagnoses:  Food impaction of esophagus, initial encounter     Rx / DC Orders   ED Discharge Orders     None        Note:  This document was prepared using Dragon voice recognition software and may include unintentional dictation errors.   Vanessa Ida [place], MD 03/06/22 [phone]

## 2022-03-06 NOTE — Anesthesia Preprocedure Evaluation (Signed)
Anesthesia Evaluation  Patient identified by MRN, date of birth, ID band Patient awake    Reviewed: Allergy & Precautions, H&P , NPO status , Patient's Chart, lab work & pertinent test results, reviewed documented beta blocker date and time   Airway Mallampati: II  TM Distance: >3 FB Neck ROM: full    Dental  (+) Teeth Intact   Pulmonary neg pulmonary ROS, Current Smoker,    Pulmonary exam normal        Cardiovascular negative cardio ROS Normal cardiovascular exam Rhythm:regular Rate:Normal     Neuro/Psych PSYCHIATRIC DISORDERS Depression Bipolar Disorder negative neurological ROS     GI/Hepatic negative GI ROS, Neg liver ROS,   Endo/Other  negative endocrine ROS  Renal/GU negative Renal ROS  negative genitourinary   Musculoskeletal   Abdominal   Peds  Hematology negative hematology ROS (+)   Anesthesia Other Findings Past Medical History: 03/18/2012: Bipolar 1 disorder, depressed ([place]) No date: Chronic hip pain     Comment:  left hip. treated with radiofrequency No date: Depression 03/28/2017: Lumbar degenerative disc disease Past Surgical History: No date: ABDOMINAL HYSTERECTOMY     Comment:  uterine ablatione (removal of lining of uterus) No date: BACK SURGERY No date: c cection 03/19/2015: ESOPHAGOGASTRODUODENOSCOPY; N/A     Comment:  Procedure: ESOPHAGOGASTRODUODENOSCOPY (EGD);  Surgeon:               Hulen Luster, MD;  Location: [facility] ENDOSCOPY;  Service:               Gastroenterology;  Laterality: N/A; No date: NO PAST SURGERIES BMI    Body Mass Index: 26.63 kg/m     Reproductive/Obstetrics negative OB ROS                             Anesthesia Physical Anesthesia Plan  ASA: 2 and emergent  Anesthesia Plan: General ETT   Post-op Pain Management:    Induction:   PONV Risk Score and Plan:   Airway Management Planned:   Additional Equipment:   Intra-op Plan:    Post-operative Plan:   Informed Consent: I have reviewed the patients History and Physical, chart, labs and discussed the procedure including the risks, benefits and alternatives for the proposed anesthesia with the patient or authorized representative who has indicated his/her understanding and acceptance.     Dental Advisory Given  Plan Discussed with: CRNA  Anesthesia Plan Comments:         Anesthesia Quick Evaluation

## 2022-03-06 NOTE — ED Notes (Signed)
Respirations unlabored. Pt complains of steak stuck in throat since last night. Hx of same.

## 2022-03-07 ENCOUNTER — Encounter: Payer: Self-pay | Admitting: Internal Medicine

## 2022-10-22 ENCOUNTER — Encounter (HOSPITAL_COMMUNITY): Payer: Self-pay

## 2022-10-23 ENCOUNTER — Other Ambulatory Visit (HOSPITAL_COMMUNITY): Payer: Self-pay

## 2022-10-23 ENCOUNTER — Inpatient Hospital Stay (HOSPITAL_COMMUNITY): Payer: Managed Care, Other (non HMO)

## 2022-10-23 ENCOUNTER — Other Ambulatory Visit: Payer: Self-pay

## 2022-10-23 ENCOUNTER — Encounter (HOSPITAL_COMMUNITY): Payer: Self-pay | Admitting: Internal Medicine

## 2022-10-23 ENCOUNTER — Inpatient Hospital Stay (HOSPITAL_COMMUNITY)
Admission: AD | Admit: 2022-10-23 | Discharge: 2022-10-25 | DRG: 270 | Disposition: A | Discharge status: Home / Self Care | Payer: Managed Care, Other (non HMO) | Source: Other Acute Inpatient Hospital | Attending: Internal Medicine | Admitting: Internal Medicine

## 2022-10-23 DIAGNOSIS — Z811 Family history of alcohol abuse and dependence: Secondary | ICD-10-CM

## 2022-10-23 DIAGNOSIS — Z8669 Personal history of other diseases of the nervous system and sense organs: Secondary | ICD-10-CM | POA: Diagnosis not present

## 2022-10-23 DIAGNOSIS — F17210 Nicotine dependence, cigarettes, uncomplicated: Secondary | ICD-10-CM | POA: Diagnosis present

## 2022-10-23 DIAGNOSIS — I82412 Acute embolism and thrombosis of left femoral vein: Secondary | ICD-10-CM | POA: Diagnosis present

## 2022-10-23 DIAGNOSIS — Z8249 Family history of ischemic heart disease and other diseases of the circulatory system: Secondary | ICD-10-CM | POA: Diagnosis not present

## 2022-10-23 DIAGNOSIS — I2699 Other pulmonary embolism without acute cor pulmonale: Secondary | ICD-10-CM | POA: Diagnosis present

## 2022-10-23 DIAGNOSIS — F319 Bipolar disorder, unspecified: Secondary | ICD-10-CM | POA: Diagnosis not present

## 2022-10-23 DIAGNOSIS — F3130 Bipolar disorder, current episode depressed, mild or moderate severity, unspecified: Secondary | ICD-10-CM | POA: Diagnosis present

## 2022-10-23 DIAGNOSIS — Z818 Family history of other mental and behavioral disorders: Secondary | ICD-10-CM | POA: Diagnosis not present

## 2022-10-23 DIAGNOSIS — Z79899 Other long term (current) drug therapy: Secondary | ICD-10-CM

## 2022-10-23 DIAGNOSIS — D696 Thrombocytopenia, unspecified: Secondary | ICD-10-CM | POA: Diagnosis present

## 2022-10-23 DIAGNOSIS — I824Y2 Acute embolism and thrombosis of unspecified deep veins of left proximal lower extremity: Secondary | ICD-10-CM | POA: Diagnosis not present

## 2022-10-23 DIAGNOSIS — I82492 Acute embolism and thrombosis of other specified deep vein of left lower extremity: Secondary | ICD-10-CM

## 2022-10-23 DIAGNOSIS — I2609 Other pulmonary embolism with acute cor pulmonale: Secondary | ICD-10-CM

## 2022-10-23 DIAGNOSIS — I871 Compression of vein: Secondary | ICD-10-CM | POA: Diagnosis present

## 2022-10-23 DIAGNOSIS — Z90710 Acquired absence of both cervix and uterus: Secondary | ICD-10-CM | POA: Diagnosis not present

## 2022-10-23 DIAGNOSIS — I82422 Acute embolism and thrombosis of left iliac vein: Secondary | ICD-10-CM | POA: Diagnosis present

## 2022-10-23 DIAGNOSIS — I82402 Acute embolism and thrombosis of unspecified deep veins of left lower extremity: Secondary | ICD-10-CM | POA: Diagnosis present

## 2022-10-23 DIAGNOSIS — I82442 Acute embolism and thrombosis of left tibial vein: Secondary | ICD-10-CM | POA: Diagnosis present

## 2022-10-23 LAB — CBC WITH DIFFERENTIAL/PLATELET
Abs Immature Granulocytes: 0.03 10*3/uL (ref 0.00–0.07)
Basophils Absolute: 0 10*3/uL (ref 0.0–0.1)
Basophils Relative: 1 %
Eosinophils Absolute: 0.3 10*3/uL (ref 0.0–0.5)
Eosinophils Relative: 4 %
HCT: 36 % (ref 36.0–46.0)
Hemoglobin: 12 g/dL (ref 12.0–15.0)
Immature Granulocytes: 0 %
Lymphocytes Relative: 23 %
Lymphs Abs: 1.7 10*3/uL (ref 0.7–4.0)
MCH: 32.4 pg (ref 26.0–34.0)
MCHC: 33.3 g/dL (ref 30.0–36.0)
MCV: 97.3 fL (ref 80.0–100.0)
Monocytes Absolute: 0.7 10*3/uL (ref 0.1–1.0)
Monocytes Relative: 9 %
Neutro Abs: 4.8 10*3/uL (ref 1.7–7.7)
Neutrophils Relative %: 63 %
Platelets: 119 10*3/uL — ABNORMAL LOW (ref 150–400)
RBC: 3.7 MIL/uL — ABNORMAL LOW (ref 3.87–5.11)
RDW: 13.4 % (ref 11.5–15.5)
WBC: 7.5 10*3/uL (ref 4.0–10.5)
nRBC: 0 % (ref 0.0–0.2)

## 2022-10-23 LAB — COMPREHENSIVE METABOLIC PANEL
ALT: 21 U/L (ref 0–44)
AST: 19 U/L (ref 15–41)
Albumin: 3.2 g/dL — ABNORMAL LOW (ref 3.5–5.0)
Alkaline Phosphatase: 103 U/L (ref 38–126)
Anion gap: 10 (ref 5–15)
BUN: 6 mg/dL (ref 6–20)
CO2: 24 mmol/L (ref 22–32)
Calcium: 8.8 mg/dL — ABNORMAL LOW (ref 8.9–10.3)
Chloride: 100 mmol/L (ref 98–111)
Creatinine, Ser: 0.74 mg/dL (ref 0.44–1.00)
GFR, Estimated: >60 mL/min (ref >60)
Glucose, Bld: 82 mg/dL (ref 70–99)
Potassium: 3.6 mmol/L (ref 3.5–5.1)
Sodium: 134 mmol/L — ABNORMAL LOW (ref 135–145)
Total Bilirubin: 0.6 mg/dL (ref 0.3–1.2)
Total Protein: 7.3 g/dL (ref 6.5–8.1)

## 2022-10-23 LAB — HEPARIN LEVEL (UNFRACTIONATED)
Heparin Unfractionated: 0.15 IU/mL — ABNORMAL LOW (ref 0.30–0.70)
Heparin Unfractionated: 0.21 IU/mL — ABNORMAL LOW (ref 0.30–0.70)
Heparin Unfractionated: 0.27 IU/mL — ABNORMAL LOW (ref 0.30–0.70)

## 2022-10-23 LAB — ECHOCARDIOGRAM COMPLETE
AR max vel: 1.99 cm2
AV Peak grad: 8.1 mmHg
Ao pk vel: 1.42 m/s
Area-P 1/2: 4.4 cm2
Height: 67.5 in
MV M vel: 1.35 m/s
MV Peak grad: 7.3 mmHg
S' Lateral: 3 cm
Weight: 2959.46 oz

## 2022-10-23 LAB — MAGNESIUM: Magnesium: 2.3 mg/dL (ref 1.7–2.4)

## 2022-10-23 MED ORDER — GABAPENTIN 400 MG PO CAPS
1200.0000 mg | ORAL_CAPSULE | Freq: Every day | ORAL | Status: DC
  Administered 2022-10-24 – 2022-10-25 (×2): 1200 mg via ORAL
  Filled 2022-10-23 (×2): qty 3

## 2022-10-23 MED ORDER — TOPIRAMATE 25 MG PO TABS: 100.0000 mg | ORAL_TABLET | ORAL | Status: DC

## 2022-10-23 MED ORDER — IOHEXOL 350 MG/ML SOLN
125.0000 mL | Freq: Once | INTRAVENOUS | Status: AC | PRN
  Administered 2022-10-23: 125 mL via INTRAVENOUS

## 2022-10-23 MED ORDER — ACETAMINOPHEN 325 MG PO TABS
650.0000 mg | ORAL_TABLET | Freq: Four times a day (QID) | ORAL | Status: DC | PRN
  Administered 2022-10-23 – 2022-10-25 (×4): 650 mg via ORAL
  Filled 2022-10-23 (×5): qty 2

## 2022-10-23 MED ORDER — LAMOTRIGINE 100 MG PO TABS: 200.0000 mg | ORAL_TABLET | Freq: Two times a day (BID) | ORAL | Status: DC

## 2022-10-23 MED ORDER — ACETAMINOPHEN 650 MG RE SUPP: 650.0000 mg | Freq: Four times a day (QID) | RECTAL | Status: DC | PRN

## 2022-10-23 MED ORDER — SUMATRIPTAN SUCCINATE 100 MG PO TABS
100.0000 mg | ORAL_TABLET | Freq: Every day | ORAL | Status: DC | PRN
  Administered 2022-10-23: 100 mg via ORAL
  Filled 2022-10-23 (×2): qty 1

## 2022-10-23 MED ORDER — FENTANYL CITRATE PF 50 MCG/ML IJ SOSY
50.0000 ug | PREFILLED_SYRINGE | INTRAMUSCULAR | Status: DC | PRN
  Administered 2022-10-23: 50 ug via INTRAVENOUS
  Filled 2022-10-23: qty 1

## 2022-10-23 MED ORDER — MELATONIN 3 MG PO TABS: 3.0000 mg | ORAL_TABLET | Freq: Every evening | ORAL | Status: DC | PRN

## 2022-10-23 MED ORDER — VORTIOXETINE HBR 5 MG PO TABS: 10.0000 mg | ORAL_TABLET | Freq: Every day | ORAL | Status: DC

## 2022-10-23 MED ORDER — LAMOTRIGINE 100 MG PO TABS
200.0000 mg | ORAL_TABLET | Freq: Every day | ORAL | Status: DC
  Administered 2022-10-23 – 2022-10-25 (×3): 200 mg via ORAL
  Filled 2022-10-23 (×3): qty 2

## 2022-10-23 MED ORDER — GABAPENTIN 400 MG PO CAPS
1500.0000 mg | ORAL_CAPSULE | Freq: Every day | ORAL | Status: DC
  Administered 2022-10-23 – 2022-10-24 (×2): 1500 mg via ORAL
  Filled 2022-10-23 (×2): qty 1

## 2022-10-23 MED ORDER — OXYCODONE HCL 5 MG PO TABS
5.0000 mg | ORAL_TABLET | Freq: Four times a day (QID) | ORAL | Status: DC | PRN
  Administered 2022-10-23 – 2022-10-24 (×6): 5 mg via ORAL
  Filled 2022-10-23 (×7): qty 1

## 2022-10-23 MED ORDER — ONDANSETRON HCL 4 MG/2ML IJ SOLN
4.0000 mg | Freq: Four times a day (QID) | INTRAMUSCULAR | Status: DC | PRN
  Administered 2022-10-23: 4 mg via INTRAVENOUS
  Filled 2022-10-23: qty 2

## 2022-10-23 MED ORDER — METHOCARBAMOL 500 MG PO TABS
750.0000 mg | ORAL_TABLET | Freq: Three times a day (TID) | ORAL | Status: DC | PRN
  Administered 2022-10-24 (×2): 750 mg via ORAL
  Filled 2022-10-23 (×2): qty 2

## 2022-10-23 MED ORDER — GABAPENTIN 300 MG PO CAPS
900.0000 mg | ORAL_CAPSULE | Freq: Every day | ORAL | Status: DC
  Administered 2022-10-23 – 2022-10-25 (×3): 900 mg via ORAL
  Filled 2022-10-23 (×3): qty 3

## 2022-10-23 MED ORDER — HYDROMORPHONE HCL 1 MG/ML IJ SOLN
0.5000 mg | INTRAMUSCULAR | Status: DC | PRN
  Administered 2022-10-23 – 2022-10-25 (×16): 0.5 mg via INTRAVENOUS
  Filled 2022-10-23 (×16): qty 0.5

## 2022-10-23 MED ORDER — NALOXONE HCL 0.4 MG/ML IJ SOLN: 0.4000 mg | INTRAMUSCULAR | Status: DC | PRN

## 2022-10-23 MED ORDER — LITHIUM CARBONATE 300 MG PO CAPS
300.0000 mg | ORAL_CAPSULE | Freq: Every day | ORAL | Status: DC
  Administered 2022-10-23 – 2022-10-25 (×3): 300 mg via ORAL
  Filled 2022-10-23 (×3): qty 1

## 2022-10-23 MED ORDER — HEPARIN (PORCINE) 25000 UT/250ML-% IV SOLN
1950.0000 [IU]/h | INTRAVENOUS | Status: AC
  Administered 2022-10-23: 1650 [IU]/h via INTRAVENOUS
  Administered 2022-10-24: 1950 [IU]/h via INTRAVENOUS
  Filled 2022-10-23 (×4): qty 250

## 2022-10-23 MED ORDER — HEPARIN BOLUS VIA INFUSION
1500.0000 [IU] | Freq: Once | INTRAVENOUS | Status: AC
  Administered 2022-10-23: 1500 [IU] via INTRAVENOUS
  Filled 2022-10-23: qty 1500

## 2022-10-23 MED ORDER — HEPARIN BOLUS VIA INFUSION
2000.0000 [IU] | Freq: Once | INTRAVENOUS | Status: AC
  Administered 2022-10-23: 2000 [IU] via INTRAVENOUS
  Filled 2022-10-23: qty 2000

## 2022-10-23 NOTE — Progress Notes (Signed)
Brief same day note:  Patient is a 52 year old female with history of migraine who presented initially at [facility] emergency department with left calf discomfort, erythema, swelling.  Venous ultrasounds showed left lower extremity DVT.  CTA chest showed acute PE without radiographic evidence of right heart strain.  Started on heparin drip.  Vascular surgery consulted for significant clot burden on the left lower extremity.  Patient seen and examined at bedside today.  Her left lower extremity is erythematous, severely edematous.  She complains of persistent pain.  She remains on room air.  Denies chest pain.  Assessment and plan  Acute PE/DVT: Initially presented with left calf discomfort, erythema, swelling.  Lower extremity venous Doppler showed common femoral to posterior tibial vein DVT.  CTA showed PE without evidence of right heart strain.  Patient is not hypoxic.  Neurovascularly intact.  Vascular surgery consulted for significant clot burden, persistent pain, edema, erythema..  Currently on heparin drip. Echocardiogram ordered.. Continue pain management, supportive care  Thrombocytopenia: continue to monitor  Bipolar disorder: On gabapentin, Lamictal, vortioxetine  History of migraines: Takes outpatient Topamax, as needed Imitrex

## 2022-10-23 NOTE — Progress Notes (Signed)
ANTICOAGULATION CONSULT NOTE - Initial Consult  Pharmacy Consult for Heparin  Indication: pulmonary embolus  No Active Allergies  Patient Measurements: Height: 5' 7.5" (171.5 cm) Weight: 82.8 kg (182 lb 8.7 oz) IBW/kg (Calculated) : 62.75 Vital Signs: Temp: 98.2 F (36.8 C) (06/05 0046) Temp Source: Oral (06/05 0046) BP: 112/64 (06/05 0046) Pulse Rate: 72 (06/05 0046)  Labs: Recent Labs    10/23/22 0207  HGB 12.0  HCT 36.0  PLT 119*  HEPARINUNFRC 0.15*  CREATININE 0.74    Estimated Creatinine Clearance: 95.1 mL/min (by C-G formula based on SCr of 0.74 mg/dL).   Medical History: Past Medical History:  Diagnosis Date   Bipolar 1 disorder, depressed (HCC) 03/18/2012   Chronic hip pain    left hip. treated with radiofrequency   Depression    Lumbar degenerative disc disease 03/28/2017    Assessment: 52 y/o F transfer from outside facility with heparin infusing at 1490 units/hr for PE. Heparin level is sub-therapeutic. Labs above reviewed.   Goal of Therapy:  Heparin level 0.3-0.7 units/ml Monitor platelets by anticoagulation protocol: Yes   Plan:  Heparin 2000 units re-bolus Inc heparin to 1650 units/hr 1200 heparin level  Abran Duke, PharmD, BCPS Clinical Pharmacist Phone: [phone]

## 2022-10-23 NOTE — Progress Notes (Signed)
ANTICOAGULATION CONSULT NOTE - Follow Up Consult  Pharmacy Consult for Heparin  Indication: DVT and acute PE   No Active Allergies  Patient Measurements: Height: 5' 7.5" (171.5 cm) Weight: 83.9 kg (184 lb 15.5 oz) IBW/kg (Calculated) : 62.75 Heparin dosing weight: 79.7 kg  Vital Signs: Temp: 98.9 F (37.2 C) (06/05 1953) Temp Source: Oral (06/05 1953) BP: 126/78 (06/05 1953) Pulse Rate: 80 (06/05 1953)  Labs: Recent Labs    10/23/22 0207 10/23/22 1215 10/23/22 2039  HGB 12.0  --   --   HCT 36.0  --   --   PLT 119*  --   --   HEPARINUNFRC 0.15* 0.21* 0.27*  CREATININE 0.74  --   --      Estimated Creatinine Clearance: 95.6 mL/min (by C-G formula based on SCr of 0.74 mg/dL).   Medical History: Past Medical History:  Diagnosis Date   Bipolar 1 disorder, depressed (HCC) 03/18/2012   Chronic hip pain    left hip. treated with radiofrequency   Depression    Lumbar degenerative disc disease 03/28/2017    Assessment: 52 y/o F transfer from outside facility with heparin infusing at 1490 units/hr for acute PE. Pt also has a LLE DVT. Pharmacy consulted to manage heparin infusion.  Heparin level 0.27, subtherapeutic Current heparin infusion rate: 1800 units/hr No issues noted with heparin infusion.   Goal of Therapy:  Heparin level 0.3-0.7 units/ml Monitor platelets by anticoagulation protocol: Yes   Plan:  Re-bolus heparin IV 1500 units x1, then  Increase heparin infusion rate to 1950 units/hr Recheck heparin level in 6 hours with AM labs.  Monitor daily heparin level, CBC, and for s/sx of bleeding F/u potential for venogram with thrombectomy tomorrow.    Estill Batten, PharmD, BCCCP  Clinical Pharmacist 10/23/2022 9:51 PM   Please refer to [facility] for pharmacy phone number

## 2022-10-23 NOTE — Plan of Care (Signed)

## 2022-10-23 NOTE — H&P (Signed)
History and Physical      Jeanette Bell ZOX:[id number] DOB: [birth year] DOA: 10/23/2022; DOS: 10/23/2022  PCP: Karene Fry Gerome Apley, PA-C  Patient coming from: home   I have personally briefly reviewed patient's old medical records in [facility]  Chief Complaint: left lower extremity pain  HPI: Jeanette Bell is a 52 y.o. female with medical history significant for migraines, who is admitted to [facility] on 10/23/2022 by way of transfer from [facility] emergency department with acute pulmonary embolism as well as acute left lower extremity DVT after presenting from home to The latter facility complaining of left lower extremity pain.   The patient reports 4 to 5 days of progressive left calf discomfort in the absence of any recent preceding trauma to the left lower extremity.  Over that timeframe, she has noted progression in the intensity and distribution of this discomfort, now with the pain, erythema, swelling, increased warmth, extending proximally, with these findings moving up just proximal to the left knee.  She denies any associated acute weakness assist with left lower extremity.  She denies any contralateral swelling, erythema, calf tenderness associate with the right lower extremity.  She denies any associated chest pain, shortness of breath, palpitations, diaphoresis, dizziness, presyncope, or syncope.  She also denies any associated subjective fever, chills, rigors, or generalized myalgias.  No recent orthopnea or PND.  Denies any personal of family history of DVT or PE, and no personal or family history of inheritable hypercoagulable state.  She conveys that her grandfather was on blood thinners, but she is unsure as to the indication for this.  Denies any recent trauma, surgical procedures, or recent periods of diminished ambulatory activity. Denies any recent travel via airplane or prolonged car trips.  She notes that she is a former smoker smoking.  Denies any estrogen  replacement therapy, or use of OCPs. Not on any anti-coagulant or anti-platelet medications at home, including no aspirin. No history of GI bleed.  In the setting of the symptoms, she had originally presented to [facility] ED on 10/21/2022, with a reported benign workup at that time.  However, after discharge to home from the emergency department on that date, she has noted further progression in the symptoms, prompting her to present back to [facility] emergency department today for further evaluation and management thereof.    [facility] ED Course:   Imaging and additional notable ED work-up: Venous ultrasound of the left lower extremity showed DVT involving the common femoral to posterior tibial veins.  Additionally, CTA chest showed evidence of acute pulmonary embolism, without radiographic evidence to suggest right heart strain.  EDP at [facility] discussed patient's case and imaging with the on-call vascular surgeon, Dr. Edilia Bo, Who conveyed that he will formally consult at [facility], and Agrees with initiation of heparin drip.   while in the ED, the following were administered: Initiation of heparin drip.  Subsequently, the patient was admitted to [facility] for further evaluation and management of presenting acute pulmonary embolism as well as acute left lower extremity DVT.     Review of Systems: As per HPI otherwise 10 point review of systems negative.   Past Medical History:  Diagnosis Date   Bipolar 1 disorder, depressed (HCC) 03/18/2012   Chronic hip pain    left hip. treated with radiofrequency   Depression    Lumbar degenerative disc disease 03/28/2017    Past Surgical History:  Procedure Laterality Date   ABDOMINAL HYSTERECTOMY     uterine ablatione (removal  of lining of uterus)   BACK SURGERY     c cection     ESOPHAGOGASTRODUODENOSCOPY N/A 03/19/2015   Procedure: ESOPHAGOGASTRODUODENOSCOPY (EGD);  Surgeon: Wallace Cullens, MD;  Location: [facility] ENDOSCOPY;  Service:  Gastroenterology;  Laterality: N/A;   ESOPHAGOGASTRODUODENOSCOPY N/A 03/06/2022   Procedure: ESOPHAGOGASTRODUODENOSCOPY (EGD);  Surgeon: Toledo, Boykin Nearing, MD;  Location: [facility] ENDOSCOPY;  Service: Gastroenterology;  Laterality: N/A;    Social History:  reports that she has been smoking cigarettes. She has been smoking an average of 1 pack per day. She has never used smokeless tobacco. She reports current alcohol use of about 8.0 standard drinks of alcohol per week. She reports that she does not use drugs.   No Active Allergies  Family History  Problem Relation Age of Onset   Depression Mother    Alcohol abuse Father    Bipolar disorder Sister    Depression Maternal Aunt    Depression Maternal Grandmother     Family history reviewed and not pertinent    Prior to Admission medications   Medication Sig Start Date End Date Taking? Authorizing Provider  acetaminophen (TYLENOL) 500 MG tablet Take 1,000 mg by mouth every 6 (six) hours as needed. For pain    [provider]  gabapentin (NEURONTIN) 300 MG capsule Take 1 capsule (300 mg total) by mouth 3 (three) times daily. 07/08/18   Arfeen, Phillips Grout, MD  hydrOXYzine (ATARAX/VISTARIL) 25 MG tablet Take 1 tablet (25 mg total) by mouth 2 (two) times daily as needed. 07/15/18   Arfeen, Phillips Grout, MD  lamoTRIgine (LAMICTAL) 200 MG tablet Take 1 tablet (200 mg total) by mouth 2 (two) times daily. 04/01/18   Arfeen, Phillips Grout, MD  methocarbamol (ROBAXIN) 750 MG tablet Take 750 mg by mouth 4 (four) times daily.    [provider]  naproxen (NAPROSYN) 500 MG tablet Take 500 mg by mouth every 12 (twelve) hours as needed. For pain. 02/10/15   [provider]  ondansetron (ZOFRAN) 4 MG tablet Take 4 mg by mouth every 8 (eight) hours as needed.    [provider]  SUMAtriptan (IMITREX) 100 MG tablet Take 100 mg by mouth daily as needed. May repeat in 2 hours if headache persists or recurs.    [provider]  topiramate  (TOPAMAX) 100 MG tablet Take 100 mg by mouth every morning.    [provider]  vortioxetine HBr (TRINTELLIX) 10 MG TABS tablet Take 1 tablet (10 mg total) by mouth daily. 05/26/18   Cleotis Nipper, MD     Objective    Physical Exam: Vitals:   10/23/22 0046  BP: 112/64  Pulse: 72  Resp: 12  Temp: 98.2 F (36.8 C)  TempSrc: Oral  SpO2: 100%  Weight: 82.8 kg  Height: 5' 7.5" (1.715 m)    General: appears to be stated age; alert, oriented Skin: warm, dry, no rash Head:  AT/[place] Mouth:  Oral mucosa membranes appear moist, normal dentition Neck: supple; trachea midline Heart:  RRR; did not appreciate any M/R/G Lungs: CTAB, did not appreciate any wheezes, rales, or rhonchi Abdomen: + BS; soft, ND, NT Vascular: 2+ pedal pulses b/l; 2+ radial pulses b/l Extremities: no muscle wasting; left lower extremity erythema, swelling, increased warmth involving the circumferential distribution of the left lower extremity at the level of the calf and extending proximally to the level proximal to the left knee. Neuro: strength and sensation intact in upper and lower extremities b/l     Labs  on Admission: I have personally reviewed following labs and imaging studies  CBC: No results for input(s): "WBC", "NEUTROABS", "HGB", "HCT", "MCV", "PLT" in the last 168 hours. Basic Metabolic Panel: No results for input(s): "NA", "K", "CL", "CO2", "GLUCOSE", "BUN", "CREATININE", "CALCIUM", "MG", "PHOS" in the last 168 hours. GFR: CrCl cannot be calculated (Patient's most recent lab result is older than the maximum 21 days allowed.). Liver Function Tests: No results for input(s): "AST", "ALT", "ALKPHOS", "BILITOT", "PROT", "ALBUMIN" in the last 168 hours. No results for input(s): "LIPASE", "AMYLASE" in the last 168 hours. No results for input(s): "AMMONIA" in the last 168 hours. Coagulation Profile: No results for input(s): "INR", "PROTIME" in the last 168 hours. Cardiac Enzymes: No results  for input(s): "CKTOTAL", "CKMB", "CKMBINDEX", "TROPONINI" in the last 168 hours. BNP (last 3 results) No results for input(s): "PROBNP" in the last 8760 hours. HbA1C: No results for input(s): "HGBA1C" in the last 72 hours. CBG: No results for input(s): "GLUCAP" in the last 168 hours. Lipid Profile: No results for input(s): "CHOL", "HDL", "LDLCALC", "TRIG", "CHOLHDL", "LDLDIRECT" in the last 72 hours. Thyroid Function Tests: No results for input(s): "TSH", "T4TOTAL", "FREET4", "T3FREE", "THYROIDAB" in the last 72 hours. Anemia Panel: No results for input(s): "VITAMINB12", "FOLATE", "FERRITIN", "TIBC", "IRON", "RETICCTPCT" in the last 72 hours. Urine analysis:    Component Value Date/Time   COLORURINE Yellow 03/16/2012 1431   APPEARANCEUR Hazy 03/16/2012 1431   LABSPEC 1.004 03/16/2012 1431   PHURINE 6.0 03/16/2012 1431   GLUCOSEU Negative 03/16/2012 1431   HGBUR 1+ 03/16/2012 1431   BILIRUBINUR Negative 03/16/2012 1431   KETONESUR Negative 03/16/2012 1431   PROTEINUR Negative 03/16/2012 1431   NITRITE Negative 03/16/2012 1431   LEUKOCYTESUR Negative 03/16/2012 1431    Radiological Exams on Admission: No results found.    Assessment/Plan   Principal Problem:   Acute pulmonary embolism (HCC) Active Problems:   Bipolar 1 disorder, depressed (HCC)   Acute deep vein thrombosis (DVT) of left lower extremity (HCC)   History of migraine      #) Acute Pulmonary Embolism: CTA chest performed at [facility] on 10/22/22 demonstarting evidence of an acute PE, without radiographic evidence of  right heart strain.  However, particular in the setting of left lower extremity swelling, albeit in the context of acute DVT, will also pursue echocardiogram to further assess for underlying evidence of right heart strain.   No evidence of hypoxia. No evidence of associated hypotension to warrant consideration for TPA administration. Appears to be patient's first PE/DVT. No obvious provoking  factors.  No utility in evaluating for inheritable hypercoagulability at this time as the inflammatory phase associated with an acute PE contributes to misleading data associated with this workup. Overall, will require at least 3-6 months of anticoagulation. Not on any blood-thinners at home, including no aspirin. Continue Heparin drip initiated in the ED.   While no overt risk factors identified, the source of patient's acute pulmonary embolism appears to be the extensive acute DVT noted in her left lower extremity, as further detailed below.     Plan: Continue heparin drip. Monitor on telemetry. Monitor for development of hypotension. Prn supplemental O2 in order to maintain oxygen saturations greater than or equal to 92%. Recheck CBC in the morning.  Echo ordered for the morning.  Further evaluation management of acute left lower extremity DVT, as further outlined below.            #) Acute left lower extremity DVT: In the setting of  presenting 4 to 5 days progressive left lower extremity pain, calf tenderness, erythema, swelling, venous ultrasound left lower extremity demonstrates extensive left lower extremity DVT involving the common femoral to posterior tibial veins.  Per my exam, left lower extremity appears to be neurovascularly intact at this time.   EDP at [facility] emergency department discussed patient's case and imaging with our on-call vascular surgeon, Dr. Edilia Bo, Who conveyed they will formally consult and see the patient in the morning, including evaluation for potential thrombectomy.  Is currently on heparin drip.  Plan: Continue heparin drip.  Prn IV Dilaudid.  Monitor strict I's and O's and daily weights.  Vascular surgery to formally consult, as above.  Repeat CBC in the morning.            #) Bipolar disorder: Documented history of such, on the following as an outpatient: Gabapentin, Lamictal, Vortioxetine.   Plan: cont outpatient Gabapentin, Lamictal,  Vortioxetine.                #) History of migraines: Documented history of such, without evidence of active migraine at this time.  On prn Imitrex as well as prophylactic scheduled Topamax at home.  Plan: Continue outpatient schedule Topamax as well as as needed Imitrex.        DVT prophylaxis:  heparin drip, as above Code Status: Full code Family Communication: none Disposition Plan: Per Rounding Team Consults called: EDP at [facility] discussed patient's case/imaging with on-call vascular surgery, Dr. Edilia Bo, Who will formally consult and see the patient in the morning, as further detailed above;  Admission status: Inpatient     I SPENT GREATER THAN 75  MINUTES IN CLINICAL CARE TIME/MEDICAL DECISION-MAKING IN COMPLETING THIS ADMISSION.      Chaney Born Gesselle Fitzsimons DO [facility]  From 7PM - 7AM   10/23/2022, 1:45 AM

## 2022-10-23 NOTE — Progress Notes (Signed)
ANTICOAGULATION CONSULT NOTE - Initial Consult  Pharmacy Consult for Heparin  Indication: pulmonary embolus  No Active Allergies  Patient Measurements: Height: 5' 7.5" (171.5 cm) Weight: 83.9 kg (184 lb 15.5 oz) IBW/kg (Calculated) : 62.75 Heparin dosing weight: 79.7 kg  Vital Signs: Temp: 98.4 F (36.9 C) (06/05 0715) Temp Source: Oral (06/05 0715) BP: 104/70 (06/05 0715) Pulse Rate: 76 (06/05 0715)  Labs: Recent Labs    10/23/22 0207 10/23/22 1215  HGB 12.0  --   HCT 36.0  --   PLT 119*  --   HEPARINUNFRC 0.15* 0.21*  CREATININE 0.74  --      Estimated Creatinine Clearance: 95.6 mL/min (by C-G formula based on SCr of 0.74 mg/dL).   Medical History: Past Medical History:  Diagnosis Date   Bipolar 1 disorder, depressed (HCC) 03/18/2012   Chronic hip pain    left hip. treated with radiofrequency   Depression    Lumbar degenerative disc disease 03/28/2017    Assessment: 52 y/o F transfer from outside facility with heparin infusing at 1490 units/hr for acute PE. Pt also has a LLE DVT. Pharmacy consulted to manage heparin infusion.  Heparin level 0.21, subtherapeutic Current heparin infusion rate: 1650 units/hr  Goal of Therapy:  Heparin level 0.3-0.7 units/ml Monitor platelets by anticoagulation protocol: Yes   Plan:  Re-bolus heparin IV 2000 units x1, then  Increase heparin infusion rate to 1800 units/hr Recheck heparin level in 6 hours Monitor daily heparin level, CBC, and for s/sx of bleeding F/u plans for possible intervention per VSS   Wilburn Cornelia, PharmD, BCPS Clinical Pharmacist 10/23/2022 1:37 PM   Please refer to [facility] for pharmacy phone number

## 2022-10-23 NOTE — Progress Notes (Signed)
Echocardiogram 2D Echocardiogram has been performed.  Jeanette Bell 10/23/2022, 11:44 AM

## 2022-10-23 NOTE — Progress Notes (Signed)
  Patient arrived form [facility] via [facility] with heparin drip running at 14.70ml/hr. Left leg pain 7/10. AO x4, vitals stable at the tim of arrival. CHG bath completed. Connected to tele and CCMD notified. BLE pedal pulse has positive doppler signal. Oriented pt to room and call bell sytem. Call bell within reach. Instructed pt to call staff for any need. Pt voiced understanding. Call bell within reach. Paged [facility] admitting and notified of patient's arrival. Plan of care continues.
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
## 2022-10-24 ENCOUNTER — Encounter (HOSPITAL_COMMUNITY)
Admission: AD | Disposition: A | Discharge status: Home / Self Care | Payer: Self-pay | Source: Other Acute Inpatient Hospital | Attending: Internal Medicine

## 2022-10-24 DIAGNOSIS — I2699 Other pulmonary embolism without acute cor pulmonale: Secondary | ICD-10-CM | POA: Diagnosis not present

## 2022-10-24 HISTORY — PX: PERIPHERAL VASCULAR THROMBECTOMY: CATH118306

## 2022-10-24 HISTORY — PX: LOWER EXTREMITY VENOGRAPHY: CATH118253

## 2022-10-24 LAB — CBC
HCT: 37.6 % (ref 36.0–46.0)
Hemoglobin: 12.3 g/dL (ref 12.0–15.0)
MCH: 32.4 pg (ref 26.0–34.0)
MCHC: 32.7 g/dL (ref 30.0–36.0)
MCV: 98.9 fL (ref 80.0–100.0)
Platelets: UNDETERMINED 10*3/uL (ref 150–400)
RBC: 3.8 MIL/uL — ABNORMAL LOW (ref 3.87–5.11)
RDW: 13.4 % (ref 11.5–15.5)
WBC: 7.8 10*3/uL (ref 4.0–10.5)
nRBC: 0 % (ref 0.0–0.2)

## 2022-10-24 LAB — HEPARIN LEVEL (UNFRACTIONATED)
Heparin Unfractionated: 0.51 IU/mL (ref 0.30–0.70)
Heparin Unfractionated: 0.44 IU/mL (ref 0.30–0.70)
Heparin Unfractionated: 0.53 IU/mL (ref 0.30–0.70)

## 2022-10-24 LAB — PLATELET COUNT: Platelets: 147 10*3/uL — ABNORMAL LOW (ref 150–400)

## 2022-10-24 SURGERY — LOWER EXTREMITY VENOGRAPHY
Anesthesia: LOCAL

## 2022-10-24 MED ORDER — LIDOCAINE HCL (PF) 1 % IJ SOLN
INTRAMUSCULAR | Status: DC | PRN
  Administered 2022-10-24: 10 mL

## 2022-10-24 MED ORDER — HEPARIN (PORCINE) IN NACL 1000-0.9 UT/500ML-% IV SOLN
INTRAVENOUS | Status: DC | PRN
  Administered 2022-10-24: 500 mL

## 2022-10-24 MED ORDER — MIDAZOLAM HCL 2 MG/2ML IJ SOLN
INTRAMUSCULAR | Status: AC
  Filled 2022-10-24: qty 2

## 2022-10-24 MED ORDER — MIDAZOLAM HCL 2 MG/2ML IJ SOLN
INTRAMUSCULAR | Status: DC | PRN
  Administered 2022-10-24 (×3): 1 mg via INTRAVENOUS

## 2022-10-24 MED ORDER — HEPARIN SODIUM (PORCINE) 1000 UNIT/ML IJ SOLN
INTRAMUSCULAR | Status: AC
  Filled 2022-10-24: qty 10

## 2022-10-24 MED ORDER — SODIUM CHLORIDE 0.9 % IV SOLN: INTRAVENOUS | Status: DC

## 2022-10-24 MED ORDER — FENTANYL CITRATE (PF) 100 MCG/2ML IJ SOLN
INTRAMUSCULAR | Status: AC
  Filled 2022-10-24: qty 2

## 2022-10-24 MED ORDER — OXYCODONE HCL 5 MG PO TABS
5.0000 mg | ORAL_TABLET | ORAL | Status: AC
  Administered 2022-10-24: 5 mg via ORAL
  Filled 2022-10-24: qty 1

## 2022-10-24 MED ORDER — LIDOCAINE HCL (PF) 1 % IJ SOLN
INTRAMUSCULAR | Status: AC
  Filled 2022-10-24: qty 30

## 2022-10-24 MED ORDER — FENTANYL CITRATE (PF) 100 MCG/2ML IJ SOLN
INTRAMUSCULAR | Status: DC | PRN
  Administered 2022-10-24 (×3): 50 ug via INTRAVENOUS

## 2022-10-24 MED ORDER — HEPARIN SODIUM (PORCINE) 1000 UNIT/ML IJ SOLN
INTRAMUSCULAR | Status: DC | PRN
  Administered 2022-10-24: 5000 [IU] via INTRAVENOUS

## 2022-10-24 MED ORDER — IODIXANOL 320 MG/ML IV SOLN
INTRAVENOUS | Status: DC | PRN
  Administered 2022-10-24: 80 mL

## 2022-10-24 SURGICAL SUPPLY — 22 items
BAG SNAP BAND KOVER 36X36 (MISCELLANEOUS) IMPLANT
BALLN ATHLETIS 10X80X75 (BALLOONS) ×2
BALLN ATLAS 14X40X75 (BALLOONS) ×2
BALLOON ATHLETIS 10X80X75 (BALLOONS) IMPLANT
BALLOON ATLAS 14X40X75 (BALLOONS) IMPLANT
CATH ANGIO 5F BER2 100CM (CATHETERS) IMPLANT
CATH CLOT TRIEVER BOLD (CATHETERS) IMPLANT
CATH NAVICROSS ANGLED 135CM (MICROCATHETER) IMPLANT
CATH VISIONS PV .035 IVUS (CATHETERS) IMPLANT
COVER DOME SNAP 22 D (MISCELLANEOUS) IMPLANT
GLIDEWIRE ADV .035X260CM (WIRE) IMPLANT
KIT ENCORE 26 ADVANTAGE (KITS) IMPLANT
KIT MICROPUNCTURE NIT STIFF (SHEATH) IMPLANT
PROTECTION STATION PRESSURIZED (MISCELLANEOUS) ×2
SHEATH CLOT RETRIEVER (SHEATH) IMPLANT
SHEATH PINNACLE 8F 10CM (SHEATH) IMPLANT
SHEATH PROBE COVER 6X72 (BAG) IMPLANT
STATION PROTECTION PRESSURIZED (MISCELLANEOUS) IMPLANT
STENT VENOUS ABRE 16X80 (Permanent Stent) IMPLANT
TRAY PV CATH (CUSTOM PROCEDURE TRAY) ×2 IMPLANT
WIRE AMPLATZ SSTIFF .035X260CM (WIRE) IMPLANT
WIRE BENTSON .035X145CM (WIRE) IMPLANT

## 2022-10-24 NOTE — Progress Notes (Signed)
ANTICOAGULATION CONSULT NOTE - Follow Up Consult  Pharmacy Consult for Heparin  Indication: DVT and acute PE   No Active Allergies  Patient Measurements: Height: 5' 7.5" (171.5 cm) Weight: 83.9 kg (184 lb 15.5 oz) IBW/kg (Calculated) : 62.75 Heparin dosing weight: 79.7 kg  Vital Signs: Temp: 99.2 F (37.3 C) (06/06 1955) Temp Source: Oral (06/06 1955) BP: 112/61 (06/06 1955) Pulse Rate: 102 (06/06 1955)  Labs: Recent Labs    10/23/22 0207 10/23/22 1215 10/24/22 0220 10/24/22 1044 10/24/22 1240 10/24/22 2111  HGB 12.0  --  12.3  --   --   --   HCT 36.0  --  37.6  --   --   --   PLT 119*  --  PLATELET CLUMPS NOTED ON SMEAR, UNABLE TO ESTIMATE  --  147*  --   HEPARINUNFRC 0.15*   < > 0.51 0.44  --  0.53  CREATININE 0.74  --   --   --   --   --    < > = values in this interval not displayed.    Estimated Creatinine Clearance: 95.6 mL/min (by C-G formula based on SCr of 0.74 mg/dL).   Medical History: Past Medical History:  Diagnosis Date   Bipolar 1 disorder, depressed (HCC) 03/18/2012   Chronic hip pain    left hip. treated with radiofrequency   Depression    Lumbar degenerative disc disease 03/28/2017    Assessment: 52 y/o F transfer from outside facility with heparin infusing at 1490 units/hr for acute PE. Pt also has a LLE DVT. Pharmacy consulted to manage heparin infusion.  Heparin level 0.53, therapeutic for goal.  Heparin was not stopped for thrombectomy.  Current heparin infusion rate: 1950 units/hr No issues noted with heparin infusion.  No bleeding post thrombectomy.   Goal of Therapy:  Heparin level 0.3-0.7 units/ml Monitor platelets by anticoagulation protocol: Yes   Plan:  Continue heparin at 1950 units/hr Monitor daily heparin level, CBC, and for s/sx of bleeding  Link Snuffer, PharmD, BCPS, BCCCP Clinical Pharmacist Please refer to [facility] for [facility] Pharmacy numbers 10/24/2022 10:10 PM

## 2022-10-24 NOTE — Progress Notes (Signed)
ANTICOAGULATION CONSULT NOTE - Initial Consult  Pharmacy Consult for Heparin  Indication: pulmonary embolus  No Active Allergies  Patient Measurements: Height: 5' 7.5" (171.5 cm) Weight: 83.9 kg (184 lb 15.5 oz) IBW/kg (Calculated) : 62.75 Heparin dosing weight: 79.7 kg  Vital Signs: Temp: 99.6 F (37.6 C) (06/06 0834) Temp Source: Oral (06/06 0834) BP: 112/67 (06/06 0834) Pulse Rate: 80 (06/06 0834)  Labs: Recent Labs    10/23/22 0207 10/23/22 1215 10/23/22 2039 10/24/22 0220 10/24/22 1044  HGB 12.0  --   --  12.3  --   HCT 36.0  --   --  37.6  --   PLT 119*  --   --  PLATELET CLUMPS NOTED ON SMEAR, UNABLE TO ESTIMATE  --   HEPARINUNFRC 0.15*   < > 0.27* 0.51 0.44  CREATININE 0.74  --   --   --   --    < > = values in this interval not displayed.     Estimated Creatinine Clearance: 95.6 mL/min (by C-G formula based on SCr of 0.74 mg/dL).   Medical History: Past Medical History:  Diagnosis Date   Bipolar 1 disorder, depressed (HCC) 03/18/2012   Chronic hip pain    left hip. treated with radiofrequency   Depression    Lumbar degenerative disc disease 03/28/2017    Assessment: 52 y/o F transfer from outside facility with heparin infusing at 1490 units/hr for acute PE. Pt also has a LLE DVT. Pharmacy consulted to manage heparin infusion.  Heparin level 0.44, therapeutic Current heparin infusion rate: 1950 units/hr  Hgb 12.3, Plt clumped and unable to be counted - stable No s/sx of bleeding reported  Goal of Therapy:  Heparin level 0.3-0.7 units/ml Monitor platelets by anticoagulation protocol: Yes   Plan:  Continue heparin infusion at 1950 units/hr Monitor daily heparin level, CBC, and for s/sx of bleeding Plan for venogram with thrombectomy with vascular surgery on 10/24/22 F/u [facility] plans post-op   Wilburn Cornelia, PharmD, BCPS Clinical Pharmacist 10/24/2022 12:53 PM   Please refer to [facility] for pharmacy phone number

## 2022-10-24 NOTE — Progress Notes (Signed)
ANTICOAGULATION CONSULT NOTE - Follow Up Consult  Pharmacy Consult for heparin Indication:  PE/DVT  Labs: Recent Labs    10/23/22 0207 10/23/22 1215 10/23/22 2039 10/24/22 0220  HGB 12.0  --   --   --   HCT 36.0  --   --   --   PLT 119*  --   --   --   HEPARINUNFRC 0.15* 0.21* 0.27* 0.51  CREATININE 0.74  --   --   --     Assessment/Plan:  52yo female therapeutic on heparin after rate change. Will continue infusion at current rate of 1950 units/hr and confirm stable with additional level.  Vernard Gambles, PharmD, BCPS 10/24/2022 4:01 AM

## 2022-10-24 NOTE — Progress Notes (Signed)
PROGRESS NOTE  Jeanette Bell  BMW:[id number] DOB: 1970-05-23 DOA: 10/23/2022 PCP: Karene Fry Gerome Apley, PA-C   Brief Narrative: Patient is a 52 year old female with history of migraine who presented initially at [facility] emergency department with left calf discomfort, erythema, swelling.  Venous ultrasounds showed left lower extremity DVT.  CTA chest showed acute PE without radiographic evidence of right heart strain.  Started on heparin drip.  Vascular surgery consulted for significant clot burden on the left lower extremity.  Plan for venogram with thrombectomy today.  Assessment & Plan:  Principal Problem:   Acute pulmonary embolism (HCC) Active Problems:   Bipolar 1 disorder, depressed (HCC)   Acute deep vein thrombosis (DVT) of left lower extremity (HCC)   History of migraine   Acute PE/DVT: Initially presented with left calf discomfort, erythema, swelling.  Lower extremity venous Doppler showed common femoral to posterior tibial vein DVT.  CTA showed PE without evidence of right heart strain.  Patient is not hypoxic.  Neurovascularly intact.  Vascular surgery consulted for significant clot burden, persistent pain, edema, erythema..  Currently on heparin drip. Echocardiogram showed EF of 60 to 65%, no wall motion abnormality, normal left ventricular diastolic parameters, normal right ventricular systolic function and size. Continue pain management, supportive care. Vascular surgery planning for venogram with thrombectomy today   Thrombocytopenia: continue to monitor   Bipolar disorder: On gabapentin, Lamictal, vortioxetine   History of migraines: Takes outpatient Topamax, as needed Imitrex            DVT prophylaxis:IV heparin     Code Status: Full Code  Family Communication: None at bedside  Patient status:Inpatient  Patient is from :Home  Anticipated discharge UV:OZDG  Estimated DC date:tomorow   Consultants: Vascular surgery  Procedures:None  yet  Antimicrobials:  Anti-infectives (From admission, onward)    None       Subjective: Patient seen and examined at bedside today.  Hemodynamically stable .  On room air.  Denies shortness of breath or cough or chest pain.  Continues to have discomfort on the left lower extremity.  Left lower extremity appears edematous  Objective: Vitals:   10/23/22 1953 10/23/22 2337 10/24/22 0317 10/24/22 0834  BP: 126/78 116/72 127/76 112/67  Pulse: 80  75 80  Resp: 19 19 16 18   Temp: 98.9 F (37.2 C) 98.9 F (37.2 C) 98.5 F (36.9 C) 99.6 F (37.6 C)  TempSrc: Oral Oral Oral Oral  SpO2: 95% 100% 98% 95%  Weight:      Height:        Intake/Output Summary (Last 24 hours) at 10/24/2022 1111 Last data filed at 10/24/2022 0800 Gross per 24 hour  Intake 258.85 ml  Output 150 ml  Net 108.85 ml   Filed Weights   10/23/22 0046 10/23/22 0600  Weight: 82.8 kg 83.9 kg    Examination:  General exam: Overall comfortable, not in distress HEENT: PERRL Respiratory system:  no wheezes or crackles  Cardiovascular system: S1 & S2 heard, RRR.  Gastrointestinal system: Abdomen is nondistended, soft and nontender. Central nervous system: Alert and oriented Extremities: Left lower extremity edema/erythema Skin: No rashes, no ulcers,no icterus     Data Reviewed: I have personally reviewed following labs and imaging studies  CBC: Recent Labs  Lab 10/23/22 0207 10/24/22 0220  WBC 7.5 7.8  NEUTROABS 4.8  --   HGB 12.0 12.3  HCT 36.0 37.6  MCV 97.3 98.9  PLT 119* PLATELET CLUMPS NOTED ON SMEAR, UNABLE TO ESTIMATE   Basic  Metabolic Panel: Recent Labs  Lab 10/23/22 0207  NA 134*  K 3.6  CL 100  CO2 24  GLUCOSE 82  BUN 6  CREATININE 0.74  CALCIUM 8.8*  MG 2.3     No results found for this or any previous visit (from the past 240 hour(s)).   Radiology Studies: CT VENOGRAM ABD/PELVIS/LOWER EXT BILAT  Result Date: 10/23/2022 CLINICAL DATA:  Leg deep vein thrombosis (DVT)  suspected EXAM: CT VENOGRAM OF THE ABDOMEN, PELVIS AND BILATERAL LOWER EXTREMITIES TECHNIQUE: Multidetector CT imaging of the abdomen, pelvis and lower extremities was performed using the standard protocol during bolus administration of intravenous contrast. Multiplanar CT image reconstructions and MIPs were obtained to evaluate the vascular anatomy. RADIATION DOSE REDUCTION: This exam was performed according to the departmental dose-optimization program which includes automated exposure control, adjustment of the mA and/or kV according to patient size and/or use of iterative reconstruction technique. CONTRAST:  OMNIPAQUE IOHEXOL 350 MG/ML SOLN COMPARISON:  CT L-spine, 08/06/2019.  CT chest, 12/09/1999. FINDINGS: VASCULAR Arterial: Normal caliber aorta, celiac artery, SMA, IMA and renal arteries. No aneurysm, dissection, vasculitis or significant stenosis. BILATERAL common, internal and external iliac arteries are patent. BILATERAL common, superficial and profunda femoral arteries and the popliteal artery are patent. RIGHT Lower Extremity Veins: Patent RIGHT pelvic and lower extremity veins, without venous abnormality within the limitations of this study. LEFT Lower Extremity Veins: Extrinsic compression of the LEFT common iliac vein by the crossing RIGHT common iliac artery (see key image) Caval thrombus extending 5 cm into the proximal (peripheral) IVC Extensive venous filling defect within the imaged portions of the LEFT popliteal, femoral, external iliac and common iliac veins Review of the MIP images confirms the above findings. NON-VASCULAR Lower chest: Mild ground-glass opacification of the dependent RIGHT lower lobe. No pleural effusion or pneumothorax of the lung bases. Hepatobiliary: No focal liver abnormality is seen. No gallstones, gallbladder wall thickening, or biliary dilatation. Pancreas: No pancreatic ductal dilatation or surrounding inflammatory changes. Spleen: Normal in size without focal  abnormality. Small perihilar accessory spleens Adrenals/Urinary Tract: Adrenal glands are unremarkable. Kidneys are normal, without renal calculi, focal lesion, or hydronephrosis. Bladder is unremarkable. Stomach/Bowel: Stomach is within normal limits. Appendix is not definitively visualized. No evidence of bowel wall thickening, distention, or inflammatory changes. Lymphatic: No enlarged abdominal or pelvic lymph nodes. Reproductive: Fibroid uterus.  Adnexa are unremarkable. Other: No abdominal wall hernia or abnormality. No abdominopelvic ascites. Musculoskeletal: Increased girth of the proximal LEFT lower extremity, with subcutaneous and myofascial fat stranding. Incidental L1 vertebral body hemangioma. Unilateral LEFT L5-S1 severe facet arthropathy. No acute or significant osseous findings. IMPRESSION: VASCULAR 1. Examination is POSITIVE for extensive DVT within the imaged portions of the LEFT popliteal, femoral, external iliac and common iliac veins. Consultation by Interventional Radiology or other qualified endovascular specialist may be warranted. 2. 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 3. Caval thrombus extending 5 cm into the peripheral IVC. NON-VASCULAR 1. No acute abdominopelvic process. 2. Mild ground-glass opacity within the dependent RIGHT lower lobe, likely to represent atelectasis though early aspiration pneumonia can appear similar. 3. Fibroid uterus. Additional incidental, chronic and senescent findings as above. These results will be called to the ordering clinician or representative by the Radiologist Assistant, and communication documented in the PACS or [employer]. Roanna Banning, MD [facility] Electronically Signed   By: Gerome Sam.D.  On: 10/23/2022 16:22   ECHOCARDIOGRAM COMPLETE  Result Date: 10/23/2022    ECHOCARDIOGRAM  REPORT   Patient Name:   Jeanette Bell [facility] Date of Exam: 10/23/2022 Medical Rec #:  [id number]      Height:       67.5 in Accession #:    [id number]     Weight:       185.0 lb Date of Birth:  1971-04-17      BSA:          1.967 m Patient Age:    51 years       BP:           102/68 mmHg Patient Gender: F              HR:           70 bpm. Exam Location:  Inpatient Procedure: 2D Echo, Cardiac Doppler and Color Doppler Indications:    Pulmnary Embolus I26.09  History:        Patient has no prior history of Echocardiogram examinations.                 Pulmonary embolus, DVT.  Sonographer:    Lucendia Herrlich Referring Phys: [id number] JUSTIN B HOWERTER IMPRESSIONS  1. Left ventricular ejection fraction, by estimation, is 60 to 65%. Left ventricular ejection fraction by 3D volume is 65 %. The left ventricle has normal function. The left ventricle has no regional wall motion abnormalities. Left ventricular diastolic  parameters were normal.  2. Right ventricular systolic function is normal. The right ventricular size is normal. There is normal pulmonary artery systolic pressure. The estimated right ventricular systolic pressure is 18.7 mmHg.  3. The mitral valve is normal in structure. No evidence of mitral valve regurgitation. No evidence of mitral stenosis.  4. The aortic valve is normal in structure. Aortic valve regurgitation is not visualized. No aortic stenosis is present.  5. The inferior vena cava is normal in size with greater than 50% respiratory variability, suggesting right atrial pressure of 3 mmHg. FINDINGS  Left Ventricle: Left ventricular ejection fraction, by estimation, is 60 to 65%. Left ventricular ejection fraction by 3D volume is 65 %. The left ventricle has normal function. The left ventricle has no regional wall motion abnormalities. The left ventricular internal cavity size was normal in size. There is no left ventricular hypertrophy. Left ventricular diastolic parameters were normal. Normal left  ventricular filling pressure. Right Ventricle: The right ventricular size is normal. No increase in right ventricular wall thickness. Right ventricular systolic function is normal. There is normal pulmonary artery systolic pressure. The tricuspid regurgitant velocity is 1.98 m/s, and  with an assumed right atrial pressure of 3 mmHg, the estimated right ventricular systolic pressure is 18.7 mmHg. Left Atrium: Left atrial size was normal in size. Right Atrium: Right atrial size was normal in size. Pericardium: There is no evidence of pericardial effusion. Mitral Valve: The mitral valve is normal in structure. No evidence of mitral valve regurgitation. No evidence of mitral valve stenosis. Tricuspid Valve: The tricuspid valve is normal in structure. Tricuspid valve regurgitation is trivial. No evidence of tricuspid stenosis. Aortic Valve: The aortic valve is normal in structure. Aortic valve regurgitation is not visualized. No aortic stenosis is present. Aortic valve peak gradient measures 8.1 mmHg. Pulmonic Valve: The pulmonic valve was normal in structure. Pulmonic valve regurgitation is not visualized. No evidence of pulmonic stenosis. Aorta: The aortic root is normal in size and structure. Venous: The inferior vena  cava is normal in size with greater than 50% respiratory variability, suggesting right atrial pressure of 3 mmHg. IAS/Shunts: No atrial level shunt detected by color flow Doppler.  LEFT VENTRICLE PLAX 2D LVIDd:         4.10 cm         Diastology LVIDs:         3.00 cm         LV e' medial:    8.59 cm/s LV PW:         0.60 cm         LV E/e' medial:  10.0 LV IVS:        0.80 cm         LV e' lateral:   15.10 cm/s LVOT diam:     1.80 cm         LV E/e' lateral: 5.7 LV SV:         50 LV SV Index:   25 LVOT Area:     2.54 cm        3D Volume EF                                LV 3D EF:    Left                                             ventricul                                             ar                                              ejection                                             fraction                                             by 3D                                             volume is                                             65 %.                                 3D Volume EF:  3D EF:        65 %                                LV EDV:       124 ml                                LV ESV:       43 ml                                LV SV:        81 ml RIGHT VENTRICLE             IVC RV S prime:     12.40 cm/s  IVC diam: 0.90 cm TAPSE (M-mode): 1.8 cm LEFT ATRIUM             Index        RIGHT ATRIUM           Index LA diam:        3.20 cm 1.63 cm/m   RA Area:     12.60 cm LA Vol (A2C):   28.1 ml 14.29 ml/m  RA Volume:   23.50 ml  11.95 ml/m LA Vol (A4C):   35.2 ml 17.90 ml/m LA Biplane Vol: 33.6 ml 17.08 ml/m  AORTIC VALVE AV Area (Vmax): 1.99 cm AV Vmax:        142.00 cm/s AV Peak Grad:   8.1 mmHg LVOT Vmax:      111.00 cm/s LVOT Vmean:     70.100 cm/s LVOT VTI:       0.197 m  AORTA Ao Root diam: 3.20 cm Ao Asc diam:  3.20 cm MITRAL VALVE               TRICUSPID VALVE MV Area (PHT): 4.40 cm    TR Peak grad:   15.7 mmHg MR Peak grad: 7.3 mmHg     TR Vmax:        198.00 cm/s MR Vmax:      135.00 cm/s MV E velocity: 86.10 cm/s  SHUNTS MV A velocity: 79.70 cm/s  Systemic VTI:  0.20 m MV E/A ratio:  1.08        Systemic Diam: 1.80 cm Armanda Magic MD Electronically signed by Armanda Magic MD Signature Date/Time: 10/23/2022/12:17:29 PM    Final     Scheduled Meds:  gabapentin  1,200 mg Oral Q0600   gabapentin  1,500 mg Oral QHS   gabapentin  900 mg Oral Q lunch   lamoTRIgine  200 mg Oral Q0600   lithium carbonate  300 mg Oral Q0600   Continuous Infusions:  sodium chloride     heparin 1,950 Units/hr (10/24/22 0342)     LOS: 1 day   Burnadette Pop, MD [facility] P6/10/2022, 11:11 AM

## 2022-10-24 NOTE — Progress Notes (Addendum)
I have seen and evaluated the patient this morning. We were consulted yesterday for acute DVT of the LLE. CT venogram demonstrated extension of the DVT into the left common iliac vein and IVC.  The patient has been set up for venogram with thrombectomy today with Dr.Clark. She is agreeable to this procedure. Her left leg is still very swollen and painful. No concern for phlegmasia, palpable DP pulse on the left.   She has been made NPO and consent order has been placed.  Loel Dubonnet, PA-C [facility] 10/24/2022, 7:51AM  VASCULAR STAFF ADDENDUM: I have independently interviewed and examined the patient. I agree with the above.  Plan LLE venogram, mechanical thrombectomy, IVUS, possible left iliac vein stenting today with Dr. Chestine Spore. Reviewed with patient and daughter Lupe Carney (heme/onc nurse - # in chart)  Rande Brunt. Lenell Antu, MD [facility] Phone Number: [phone] 10/24/2022 10:01 AM

## 2022-10-24 NOTE — Op Note (Signed)
    Patient name: KALYIAH WORMWOOD MRN: [id number] DOB: 1971/05/14 Sex: female  10/24/2022 Pre-operative Diagnosis: Left lower extremity iliofemoral deep venous thrombosis Post-operative diagnosis:  Same Surgeon:  Victorino Sparrow, MD Procedure Performed: 1.  Ultrasound-guided micropuncture access of the left popliteal vein 2.  Left leg venogram 3.  Ilio cavogram 4.  Percutaneous mechanical thrombectomy using Inari clot Treiver. 5.  Intravascular ultrasound of the inferior vena cava, left common iliac vein, external iliac vein, common femoral vein, femoral vein 6.  16mm x 80 mm Abre stent postdilated using a 14x60mm noncompliant balloon. 7.  Access managed using Monocryl suture   Indications: Patient is a 52 year old female who presented to the ED with left-sided lower extremity swelling with CT venogram demonstrating thrombus extending from the inferior vena cava through the left leg.  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  After discussing the risks and benefits of percutaneous mechanical thrombectomy, possible stenting for May Thurner syndrome, Ajane elected to proceed.  Findings:  Thrombus in the left lower extremity involving the popliteal vein, femoral vein, common femoral vein, external iliac vein, common femoral vein.  Inferior vena cava with partially occlusive thrombus.  Severe compression of the left common iliac vein appreciated using intravascular ultrasound.   Procedure:   Patient was brought to the Cath Lab and laid in the prone position.  She was prepped and draped in standard fashion and moderate anesthesia was induced.  The case began with an ultrasound-guided micropuncture needle which was used to access the left popliteal vein.  A wire was run into the left subclavian vein.  Next, diagnostic venogram of the left lower extremity, iliac veins, and inferior IVC followed.  The results above.  I  elected to attempt intervention.  The patient was heparinized with 5000 units IV heparin and the 13 [place] Inari clot Treiver sheath was placed in the popliteal fossa.  The device was run into the inferior vena cava, above the level of the thrombus, and an total of 4 passes were made at the 12:00, 3:00, 6:00, 9:00 positions.  There was significant thrombotic return.  All of this appeared acute.  Venography followed demonstrated significant improvement with resolution of the thrombotic burden.  There was still significant compression.  Intravascular ultrasound was brought into the field and run from the popliteal vein into the inferior vena cava.  Please see supplemental imaging for the ultrasound run.  The common iliac below the level of the compression measured 13.5 mm, therefore I elected to use a 16 x 80 mm Abre venous stent.  Prior to that appointment, I predilated the left common iliac vein using a 10 x 80 mm balloon.  The 16 x 80 mm Abre stent was then placed and postdilated with a 14x80 mm.  Follow-up venography demonstrated excellent result with resolution of the stenosis.  This was confirmed with the use of intravascular ultrasound.  The stent extended from the iliac vein confluence into the external iliac vein.   Impression: Successful percutaneous thrombectomy of iliofemoral DVT.  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    Fara Olden, MD [facility] [place] Office: [phone]

## 2022-10-25 ENCOUNTER — Encounter (HOSPITAL_COMMUNITY): Payer: Self-pay | Admitting: Vascular Surgery

## 2022-10-25 ENCOUNTER — Other Ambulatory Visit (HOSPITAL_COMMUNITY): Payer: Self-pay

## 2022-10-25 DIAGNOSIS — I2699 Other pulmonary embolism without acute cor pulmonale: Secondary | ICD-10-CM | POA: Diagnosis not present

## 2022-10-25 LAB — CBC
HCT: 32.4 % — ABNORMAL LOW (ref 36.0–46.0)
Hemoglobin: 10.6 g/dL — ABNORMAL LOW (ref 12.0–15.0)
MCH: 32.1 pg (ref 26.0–34.0)
MCHC: 32.7 g/dL (ref 30.0–36.0)
MCV: 98.2 fL (ref 80.0–100.0)
Platelets: 197 10*3/uL (ref 150–400)
RBC: 3.3 MIL/uL — ABNORMAL LOW (ref 3.87–5.11)
RDW: 13.4 % (ref 11.5–15.5)
WBC: 8.4 10*3/uL (ref 4.0–10.5)
nRBC: 0 % (ref 0.0–0.2)

## 2022-10-25 LAB — HEPARIN LEVEL (UNFRACTIONATED): Heparin Unfractionated: 0.46 IU/mL (ref 0.30–0.70)

## 2022-10-25 MED ORDER — ASPIRIN 81 MG PO TBEC
81.0000 mg | DELAYED_RELEASE_TABLET | Freq: Every day | ORAL | 0 refills | Status: AC
  Filled 2022-10-25: qty 120, 120d supply, fill #0

## 2022-10-25 MED ORDER — APIXABAN 5 MG PO TABS
ORAL_TABLET | ORAL | 0 refills | Status: DC
  Filled 2022-10-25: qty 26, 7d supply, fill #0
  Filled 2022-10-25: qty 74, 30d supply, fill #0

## 2022-10-25 MED ORDER — ASPIRIN 81 MG PO TBEC
81.0000 mg | DELAYED_RELEASE_TABLET | Freq: Every day | ORAL | Status: DC
  Administered 2022-10-25: 81 mg via ORAL
  Filled 2022-10-25: qty 1

## 2022-10-25 MED ORDER — APIXABAN 5 MG PO TABS: 5.0000 mg | ORAL_TABLET | Freq: Two times a day (BID) | ORAL | Status: DC

## 2022-10-25 MED ORDER — APIXABAN 5 MG PO TABS
ORAL_TABLET | ORAL | 0 refills | Status: DC
  Filled 2022-10-25: qty 74, 30d supply, fill #0

## 2022-10-25 MED ORDER — APIXABAN 5 MG PO TABS
10.0000 mg | ORAL_TABLET | Freq: Two times a day (BID) | ORAL | Status: DC
  Administered 2022-10-25: 10 mg via ORAL
  Filled 2022-10-25: qty 2

## 2022-10-25 MED ORDER — METHOCARBAMOL 750 MG PO TABS: 750.0000 mg | ORAL_TABLET | Freq: Four times a day (QID) | ORAL | Status: DC | PRN

## 2022-10-25 NOTE — Discharge Summary (Signed)
Physician Discharge Summary  Jeanette Bell EXB:[id number] DOB: 10-07-1970 DOA: 10/23/2022  PCP: Karene Fry Gerome Apley, PA-C  Admit date: 10/23/2022 Discharge date: 10/25/2022  Admitted From: Home Disposition:  Home  Discharge Condition:Stable CODE STATUS:FULL Diet recommendation: Regular   Brief/Interim Summary: Patient is a 52 year old female with history of migraine who presented initially at [facility] emergency department with left calf discomfort, erythema, swelling.  Venous ultrasounds showed left lower extremity DVT.  CTA chest showed acute PE without radiographic evidence of right heart strain.  Started on heparin drip.  Vascular surgery consulted for significant clot burden on the left lower extremity.  S/p venogram with thrombectomy on 6/6.  Transitioned to Eliquis.  Vascular surgery cleared for discharge and outpatient follow-up.  Medically stable for discharge to home today  Following problems were addressed during the hospitalization:    Acute PE/DVT: Initially presented with left calf discomfort, erythema, swelling.  Lower extremity venous Doppler showed common femoral to posterior tibial vein DVT.  CTA showed PE without evidence of right heart strain.  Patient is not hypoxic.  Neurovascularly intact.  Vascular surgery consulted for significant clot burden, persistent pain, edema, erythema Started  on heparin drip. Echocardiogram showed EF of 60 to 65%, no wall motion abnormality, normal left ventricular diastolic parameters, normal right ventricular systolic function and size. Vascular surgery consulted for significant clot burden on the left lower extremity.  S/p venogram with thrombectomy on 6/6.  Transitioned to Eliquis.  Vascular surgery cleared for discharge and outpatient follow-up.  Thrombocytopenia: Stable now  Bipolar disorder: On gabapentin, Lamictal, vortioxetine   History of migraines: Takes outpatient Topamax, as needed Imitrex  Discharge Diagnoses:  Principal  Problem:   Acute pulmonary embolism (HCC) Active Problems:   Bipolar 1 disorder, depressed (HCC)   Acute deep vein thrombosis (DVT) of left lower extremity (HCC)   History of migraine    Discharge Instructions  Discharge Instructions     Diet - low sodium heart healthy   Complete by: As directed    Discharge instructions   Complete by: As directed    1)Please take prescribed medications as instructed 2)Follow up with your PCP in a week.  Follow-up with vascular surgery   Increase activity slowly   Complete by: As directed       Allergies as of 10/25/2022   No Active Allergies      Medication List     STOP taking these medications    norethindrone 5 MG tablet Commonly known as: AYGESTIN   vortioxetine HBr 10 MG Tabs tablet Commonly known as: Trintellix       TAKE these medications    acetaminophen 650 MG CR tablet Commonly known as: TYLENOL Take 650 mg by mouth every 8 (eight) hours as needed for pain (arthritis pain).   apixaban 5 MG Tabs tablet Commonly known as: ELIQUIS Take 2 tablets (10 mg total) by mouth 2 (two) times daily for 7 days.   apixaban 5 MG Tabs tablet Commonly known as: ELIQUIS Take 1 tablet (5 mg total) by mouth 2 (two) times daily. Start taking on: November 01, 2022   aspirin EC 81 MG tablet Take 1 tablet (81 mg total) by mouth daily. Swallow whole. Start taking on: October 26, 2022   Cholecalciferol 25 MCG (1000 UT) Tbdp Take 1,000 Units by mouth daily.   gabapentin 300 MG capsule Commonly known as: NEURONTIN Take 1 capsule (300 mg total) by mouth 3 (three) times daily. What changed:  how much to take additional instructions  hydrOXYzine 25 MG tablet Commonly known as: ATARAX Take 1 tablet (25 mg total) by mouth 2 (two) times daily as needed.   lamoTRIgine 200 MG tablet Commonly known as: LAMICTAL Take 1 tablet (200 mg total) by mouth 2 (two) times daily. What changed: when to take this   lithium carbonate 300 MG  capsule Take 300 mg by mouth every morning.   methocarbamol 750 MG tablet Commonly known as: ROBAXIN Take 1 tablet (750 mg total) by mouth every 6 (six) hours as needed for muscle spasms. What changed:  when to take this reasons to take this   naproxen 500 MG tablet Commonly known as: NAPROSYN Take 500 mg by mouth every 12 (twelve) hours as needed. For pain.   ondansetron 4 MG tablet Commonly known as: ZOFRAN Take 4 mg by mouth every 8 (eight) hours as needed.   SUMAtriptan 100 MG tablet Commonly known as: IMITREX Take 100 mg by mouth daily as needed. May repeat in 2 hours if headache persists or recurs.   topiramate 100 MG tablet Commonly known as: TOPAMAX Take 100 mg by mouth every morning.   traMADol 50 MG tablet Commonly known as: ULTRAM Take 50 mg by mouth every 6 (six) hours as needed for moderate pain or severe pain.        Follow-up Information     [facility] Follow up in 5 week(s).   Specialty: Vascular Surgery Why: sent Contact information: [address] [place] [address] [phone]        Driggers, Gerome Apley, PA-C. Schedule an appointment as soon as possible for a visit in 1 week(s).   Specialty: Physician Assistant Contact information: [address] [place] [address] [phone]                No Active Allergies  Consultations: Vascular surgery   Procedures/Studies: PERIPHERAL VASCULAR CATHETERIZATION  Result Date: 10/25/2022 Images from the original result were not included.   Patient name: Jeanette Bell          MRN: [id number]        DOB: 1971/01/31          Sex: female  10/24/2022 Pre-operative Diagnosis: Left lower extremity iliofemoral deep venous thrombosis Post-operative diagnosis:  Same Surgeon:  Victorino Sparrow, MD Procedure Performed: 1.  Ultrasound-guided micropuncture access of the left popliteal vein 2.  Left leg venogram 3.  Ilio cavogram 4.   Percutaneous mechanical thrombectomy using Inari clot Treiver. 5.  Intravascular ultrasound of the inferior vena cava, left common iliac vein, external iliac vein, common femoral vein, femoral vein 6.  16mm x 80 mm Abre stent postdilated using a 14x76mm noncompliant balloon. 7.  Access managed using Monocryl suture   Indications: Patient is a 52 year old female who presented to the ED with left-sided lower extremity swelling with CT venogram demonstrating thrombus extending from the inferior vena cava through the left leg.  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  Findings: Thrombus in the left lower extremity involving the popliteal vein, femoral vein, common femoral vein, external iliac vein, common femoral vein.  Inferior vena cava with partially occlusive thrombus. Severe compression of the left common iliac vein appreciated using intravascular ultrasound.  Procedure:  Patient was brought to the Cath Lab and laid in the prone position.  She was prepped and draped in standard fashion and moderate anesthesia was induced.  The case began with an ultrasound-guided micropuncture needle which was used to access the left popliteal vein.  A wire was run into the left subclavian vein.  Next, diagnostic venogram of the left lower extremity, iliac veins, and inferior IVC followed.  The results above.  I elected to attempt intervention.  The patient was heparinized with 5000 units IV heparin and the 13 [place] Inari clot Treiver sheath was placed in the popliteal fossa.  The device was run into the inferior vena cava, above the level of the thrombus, and an total of 4 passes were made at the 12:00, 3:00, 6:00, 9:00 positions.  There was significant thrombotic return.  All of this  appeared acute.  Venography followed demonstrated significant improvement with resolution of the thrombotic burden.  There was still significant compression.  Intravascular ultrasound was brought into the field and run from the popliteal vein into the inferior vena cava.  Please see supplemental imaging for the ultrasound run.  The common iliac below the level of the compression measured 13.5 mm, therefore I elected to use a 16 x 80 mm Abre venous stent.  Prior to that appointment, I predilated the left common iliac vein using a 10 x 80 mm balloon.  The 16 x 80 mm Abre stent was then placed and postdilated with a 14x80 mm.  Follow-up venography demonstrated excellent result with resolution of the stenosis.  This was confirmed with the use of intravascular ultrasound.  The stent extended from the iliac vein confluence into the external iliac vein.   Impression: Successful percutaneous thrombectomy of iliofemoral DVT.  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    Fara Olden, MD [facility] Office: [phone]   CT VENOGRAM ABD/PELVIS/LOWER EXT BILAT  Result Date: 10/23/2022 CLINICAL DATA:  Leg deep vein thrombosis (DVT) suspected EXAM: CT VENOGRAM OF THE ABDOMEN, PELVIS AND BILATERAL LOWER EXTREMITIES TECHNIQUE: Multidetector CT imaging of the abdomen, pelvis and lower extremities was performed using the standard protocol during bolus administration of intravenous contrast. Multiplanar CT image reconstructions and MIPs were obtained to evaluate the vascular anatomy. RADIATION DOSE REDUCTION: This exam was performed according to the departmental dose-optimization program which includes automated exposure control, adjustment of the mA and/or kV according to patient size and/or use of iterative reconstruction technique. CONTRAST:  OMNIPAQUE IOHEXOL 350 MG/ML SOLN COMPARISON:  CT L-spine, 08/06/2019.  CT chest, 12/09/1999. FINDINGS: VASCULAR  Arterial: Normal caliber aorta, celiac artery, SMA, IMA and renal arteries. No aneurysm, dissection, vasculitis or significant stenosis. BILATERAL common, internal and external iliac arteries are patent. BILATERAL common, superficial and profunda femoral arteries and the popliteal artery are patent. RIGHT Lower Extremity Veins: Patent RIGHT pelvic and lower extremity veins, without venous abnormality within the limitations of this study. LEFT Lower Extremity Veins: Extrinsic compression of the LEFT common iliac vein by the crossing RIGHT common iliac artery (see key image) Caval thrombus extending 5 cm into the proximal (peripheral) IVC Extensive venous filling defect within the imaged portions of the LEFT popliteal, femoral, external iliac and common iliac veins Review of the MIP images confirms the above findings. NON-VASCULAR Lower chest: Mild ground-glass opacification of the dependent RIGHT lower lobe. No pleural effusion or pneumothorax of the lung bases. Hepatobiliary: No focal liver abnormality is seen. No  gallstones, gallbladder wall thickening, or biliary dilatation. Pancreas: No pancreatic ductal dilatation or surrounding inflammatory changes. Spleen: Normal in size without focal abnormality. Small perihilar accessory spleens Adrenals/Urinary Tract: Adrenal glands are unremarkable. Kidneys are normal, without renal calculi, focal lesion, or hydronephrosis. Bladder is unremarkable. Stomach/Bowel: Stomach is within normal limits. Appendix is not definitively visualized. No evidence of bowel wall thickening, distention, or inflammatory changes. Lymphatic: No enlarged abdominal or pelvic lymph nodes. Reproductive: Fibroid uterus.  Adnexa are unremarkable. Other: No abdominal wall hernia or abnormality. No abdominopelvic ascites. Musculoskeletal: Increased girth of the proximal LEFT lower extremity, with subcutaneous and myofascial fat stranding. Incidental L1 vertebral body hemangioma. Unilateral LEFT L5-S1  severe facet arthropathy. No acute or significant osseous findings. IMPRESSION: VASCULAR 1. Examination is POSITIVE for extensive DVT within the imaged portions of the LEFT popliteal, femoral, external iliac and common iliac veins. Consultation by Interventional Radiology or other qualified endovascular specialist may be warranted. 2. 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 3. Caval thrombus extending 5 cm into the peripheral IVC. NON-VASCULAR 1. No acute abdominopelvic process. 2. Mild ground-glass opacity within the dependent RIGHT lower lobe, likely to represent atelectasis though early aspiration pneumonia can appear similar. 3. Fibroid uterus. Additional incidental, chronic and senescent findings as above. These results will be called to the ordering clinician or representative by the Radiologist Assistant, and communication documented in the PACS or [employer]. Roanna Banning, MD [facility] Electronically Signed   By: Roanna Banning M.D.   On: 10/23/2022 16:22   ECHOCARDIOGRAM COMPLETE  Result Date: 10/23/2022    ECHOCARDIOGRAM REPORT   Patient Name:   Jeanette Bell [facility] Date of Exam: 10/23/2022 Medical Rec #:  [id number]      Height:       67.5 in Accession #:    [id number]     Weight:       185.0 lb Date of Birth:  Mar 14, 1971      BSA:          1.967 m Patient Age:    51 years       BP:           102/68 mmHg Patient Gender: F              HR:           70 bpm. Exam Location:  Inpatient Procedure: 2D Echo, Cardiac Doppler and Color Doppler Indications:    Pulmnary Embolus I26.09  History:        Patient has no prior history of Echocardiogram examinations.                 Pulmonary embolus, DVT.  Sonographer:    Lucendia Herrlich Referring Phys: [id number] JUSTIN B HOWERTER IMPRESSIONS  1. Left ventricular ejection fraction, by estimation, is 60 to 65%. Left  ventricular ejection fraction by 3D volume is 65 %. The left ventricle has normal function. The left ventricle has no regional wall motion abnormalities. Left ventricular diastolic  parameters were normal.  2. Right ventricular systolic function is normal. The right ventricular size is normal. There is normal pulmonary artery systolic pressure. The estimated right ventricular systolic pressure is 18.7 mmHg.  3. The mitral valve is normal in structure. No evidence of mitral valve regurgitation. No evidence of mitral stenosis.  4. The aortic valve is normal in structure. Aortic valve regurgitation is not visualized.  No aortic stenosis is present.  5. The inferior vena cava is normal in size with greater than 50% respiratory variability, suggesting right atrial pressure of 3 mmHg. FINDINGS  Left Ventricle: Left ventricular ejection fraction, by estimation, is 60 to 65%. Left ventricular ejection fraction by 3D volume is 65 %. The left ventricle has normal function. The left ventricle has no regional wall motion abnormalities. The left ventricular internal cavity size was normal in size. There is no left ventricular hypertrophy. Left ventricular diastolic parameters were normal. Normal left ventricular filling pressure. Right Ventricle: The right ventricular size is normal. No increase in right ventricular wall thickness. Right ventricular systolic function is normal. There is normal pulmonary artery systolic pressure. The tricuspid regurgitant velocity is 1.98 m/s, and  with an assumed right atrial pressure of 3 mmHg, the estimated right ventricular systolic pressure is 18.7 mmHg. Left Atrium: Left atrial size was normal in size. Right Atrium: Right atrial size was normal in size. Pericardium: There is no evidence of pericardial effusion. Mitral Valve: The mitral valve is normal in structure. No evidence of mitral valve regurgitation. No evidence of mitral valve stenosis. Tricuspid Valve: The tricuspid valve is normal  in structure. Tricuspid valve regurgitation is trivial. No evidence of tricuspid stenosis. Aortic Valve: The aortic valve is normal in structure. Aortic valve regurgitation is not visualized. No aortic stenosis is present. Aortic valve peak gradient measures 8.1 mmHg. Pulmonic Valve: The pulmonic valve was normal in structure. Pulmonic valve regurgitation is not visualized. No evidence of pulmonic stenosis. Aorta: The aortic root is normal in size and structure. Venous: The inferior vena cava is normal in size with greater than 50% respiratory variability, suggesting right atrial pressure of 3 mmHg. IAS/Shunts: No atrial level shunt detected by color flow Doppler.  LEFT VENTRICLE PLAX 2D LVIDd:         4.10 cm         Diastology LVIDs:         3.00 cm         LV e' medial:    8.59 cm/s LV PW:         0.60 cm         LV E/e' medial:  10.0 LV IVS:        0.80 cm         LV e' lateral:   15.10 cm/s LVOT diam:     1.80 cm         LV E/e' lateral: 5.7 LV SV:         50 LV SV Index:   25 LVOT Area:     2.54 cm        3D Volume EF                                LV 3D EF:    Left                                             ventricul                                             ar  ejection                                             fraction                                             by 3D                                             volume is                                             65 %.                                 3D Volume EF:                                3D EF:        65 %                                LV EDV:       124 ml                                LV ESV:       43 ml                                LV SV:        81 ml RIGHT VENTRICLE             IVC RV S prime:     12.40 cm/s  IVC diam: 0.90 cm TAPSE (M-mode): 1.8 cm LEFT ATRIUM             Index        RIGHT ATRIUM           Index LA diam:        3.20 cm 1.63 cm/m   RA Area:     12.60 cm LA Vol (A2C):   28.1  ml 14.29 ml/m  RA Volume:   23.50 ml  11.95 ml/m LA Vol (A4C):   35.2 ml 17.90 ml/m LA Biplane Vol: 33.6 ml 17.08 ml/m  AORTIC VALVE AV Area (Vmax): 1.99 cm AV Vmax:        142.00 cm/s AV Peak Grad:   8.1 mmHg LVOT Vmax:      111.00 cm/s LVOT Vmean:     70.100 cm/s LVOT VTI:       0.197 m  AORTA Ao Root diam: 3.20 cm Ao Asc diam:  3.20 cm MITRAL VALVE               TRICUSPID VALVE MV Area (PHT): 4.40  cm    TR Peak grad:   15.7 mmHg MR Peak grad: 7.3 mmHg     TR Vmax:        198.00 cm/s MR Vmax:      135.00 cm/s MV E velocity: 86.10 cm/s  SHUNTS MV A velocity: 79.70 cm/s  Systemic VTI:  0.20 m MV E/A ratio:  1.08        Systemic Diam: 1.80 cm Armanda Magic MD Electronically signed by Armanda Magic MD Signature Date/Time: 10/23/2022/12:17:29 PM    Final       Subjective: Patient seen and examined the bedside today.  Hemodynamically stable comfortable.  Medically stable for discharge home today.  She will follow-up with vascular surgery as an outpatient.  Discharge Exam: Vitals:   10/25/22 0339 10/25/22 0801  BP: 95/63 110/71  Pulse: 83 90  Resp: 15 20  Temp: 98.8 F (37.1 C) 99.1 F (37.3 C)  SpO2: 97% 96%   Vitals:   10/24/22 1955 10/24/22 2338 10/25/22 0339 10/25/22 0801  BP: 112/61 (!) 108/59 95/63 110/71  Pulse: (!) 102 89 83 90  Resp: 20 20 15 20   Temp: 99.2 F (37.3 C) 100.3 F (37.9 C) 98.8 F (37.1 C) 99.1 F (37.3 C)  TempSrc: Oral Oral Oral Oral  SpO2: 100% 98% 97% 96%  Weight:      Height:        General: Pt is alert, awake, not in acute distress Cardiovascular: RRR, S1/S2 +, no rubs, no gallops Respiratory: CTA bilaterally, no wheezing, no rhonchi Abdominal: Soft, NT, ND, bowel sounds + Extremities: Left lower extremity edema, no cyanosis    The results of significant diagnostics from this hospitalization (including imaging, microbiology, ancillary and laboratory) are listed below for reference.     Microbiology: No results found for this or any previous  visit (from the past 240 hour(s)).   Labs: BNP (last 3 results) No results for input(s): "BNP" in the last 8760 hours. Basic Metabolic Panel: Recent Labs  Lab 10/23/22 0207  NA 134*  K 3.6  CL 100  CO2 24  GLUCOSE 82  BUN 6  CREATININE 0.74  CALCIUM 8.8*  MG 2.3   Liver Function Tests: Recent Labs  Lab 10/23/22 0207  AST 19  ALT 21  ALKPHOS 103  BILITOT 0.6  PROT 7.3  ALBUMIN 3.2*   No results for input(s): "LIPASE", "AMYLASE" in the last 168 hours. No results for input(s): "AMMONIA" in the last 168 hours. CBC: Recent Labs  Lab 10/23/22 0207 10/24/22 0220 10/24/22 1240 10/25/22 0157  WBC 7.5 7.8  --  8.4  NEUTROABS 4.8  --   --   --   HGB 12.0 12.3  --  10.6*  HCT 36.0 37.6  --  32.4*  MCV 97.3 98.9  --  98.2  PLT 119* PLATELET CLUMPS NOTED ON SMEAR, UNABLE TO ESTIMATE 147* 197   Cardiac Enzymes: No results for input(s): "CKTOTAL", "CKMB", "CKMBINDEX", "TROPONINI" in the last 168 hours. BNP: Invalid input(s): "POCBNP" CBG: No results for input(s): "GLUCAP" in the last 168 hours. D-Dimer No results for input(s): "DDIMER" in the last 72 hours. Hgb A1c No results for input(s): "HGBA1C" in the last 72 hours. Lipid Profile No results for input(s): "CHOL", "HDL", "LDLCALC", "TRIG", "CHOLHDL", "LDLDIRECT" in the last 72 hours. Thyroid function studies No results for input(s): "TSH", "T4TOTAL", "T3FREE", "THYROIDAB" in the last 72 hours.  Invalid input(s): "FREET3" Anemia work up No results for input(s): "VITAMINB12", "FOLATE", "FERRITIN", "TIBC", "IRON", "RETICCTPCT"  in the last 72 hours. Urinalysis    Component Value Date/Time   COLORURINE Yellow 03/16/2012 1431   APPEARANCEUR Hazy 03/16/2012 1431   LABSPEC 1.004 03/16/2012 1431   PHURINE 6.0 03/16/2012 1431   GLUCOSEU Negative 03/16/2012 1431   HGBUR 1+ 03/16/2012 1431   BILIRUBINUR Negative 03/16/2012 1431   KETONESUR Negative 03/16/2012 1431   PROTEINUR Negative 03/16/2012 1431   NITRITE  Negative 03/16/2012 1431   LEUKOCYTESUR Negative 03/16/2012 1431   Sepsis Labs Recent Labs  Lab 10/23/22 0207 10/24/22 0220 10/25/22 0157  WBC 7.5 7.8 8.4   Microbiology No results found for this or any previous visit (from the past 240 hour(s)).  Please note: You were cared for by a hospitalist during your hospital stay. Once you are discharged, your primary care physician will handle any further medical issues. Please note that NO REFILLS for any discharge medications will be authorized once you are discharged, as it is imperative that you return to your primary care physician (or establish a relationship with a primary care physician if you do not have one) for your post hospital discharge needs so that they can reassess your need for medications and monitor your lab values.    Time coordinating discharge: 40 minutes  SIGNED:   Burnadette Pop, MD  [facility] 10/25/2022, 10:49 AM Pager [phone]  If 7PM-7AM, please contact night-coverage [email or web address] Password [email or web address]

## 2022-10-25 NOTE — Progress Notes (Addendum)
  Progress Note    10/25/2022 7:37 AM 1 Day Post-Op  Subjective:  L leg feels much better compared to before surgery   Vitals:   10/24/22 2338 10/25/22 0339  BP: (!) 108/59 95/63  Pulse: 89 83  Resp: 20 15  Temp: 100.3 F (37.9 C) 98.8 F (37.1 C)  SpO2: 98% 97%   Physical Exam: Lungs:  non labored Extremities:  palpable L DP pulse; motor and sensation intact Neurologic: A&O  CBC    Component Value Date/Time   WBC 8.4 10/25/2022 0157   RBC 3.30 (L) 10/25/2022 0157   HGB 10.6 (L) 10/25/2022 0157   HGB 13.7 08/19/2016 0937   HCT 32.4 (L) 10/25/2022 0157   HCT 40.6 08/19/2016 0937   PLT 197 10/25/2022 0157   PLT 339 08/19/2016 0937   MCV 98.2 10/25/2022 0157   MCV 102 (H) 08/19/2016 0937   MCV 98 03/16/2012 1431   MCH 32.1 10/25/2022 0157   MCHC 32.7 10/25/2022 0157   RDW 13.4 10/25/2022 0157   RDW 14.2 08/19/2016 0937   RDW 12.9 03/16/2012 1431   LYMPHSABS 1.7 10/23/2022 0207   LYMPHSABS 2.3 08/19/2016 0937   MONOABS 0.7 10/23/2022 0207   EOSABS 0.3 10/23/2022 0207   EOSABS 0.1 08/19/2016 0937   BASOSABS 0.0 10/23/2022 0207   BASOSABS 0.0 08/19/2016 0937    BMET    Component Value Date/Time   NA 134 (L) 10/23/2022 0207   NA 139 03/16/2012 1431   K 3.6 10/23/2022 0207   K 4.3 03/16/2012 1431   CL 100 10/23/2022 0207   CL 102 03/16/2012 1431   CO2 24 10/23/2022 0207   CO2 30 03/16/2012 1431   GLUCOSE 82 10/23/2022 0207   GLUCOSE 95 03/16/2012 1431   BUN 6 10/23/2022 0207   BUN 10 03/16/2012 1431   CREATININE 0.74 10/23/2022 0207   CREATININE 0.81 03/16/2012 1431   CALCIUM 8.8 (L) 10/23/2022 0207   CALCIUM 9.7 03/16/2012 1431   GFRNONAA >60 10/23/2022 0207   GFRNONAA >60 03/16/2012 1431   GFRAA >60 03/19/2015 1743   GFRAA >60 03/16/2012 1431    INR    Component Value Date/Time   INR 0.88 03/19/2015 1743     Intake/Output Summary (Last 24 hours) at 10/25/2022 0737 Last data filed at 10/24/2022 1935 Gross per 24 hour  Intake 952.91 ml   Output 150 ml  Net 802.91 ml     Assessment/Plan:  52 y.o. female is s/p L LE thrombectomy and iliac vein stent placement 1 Day Post-Op   Subjectively LLE much improved after surgery Thigh high compression ordered; pt educateed on proper use Ok to transition to DOAC from heparin; 81mg  aspirin also added Ok for discharge from vascular standpoint after ambulating and after being fitted with thigh high compression.  Office will arrange iliac and LLE venous duplex in 4-6 weeks   Emilie Rutter, PA-C [facility] [phone] 10/25/2022 7:37 AM  VASCULAR STAFF ADDENDUM: I have independently interviewed and examined the patient. I agree with the above.  Home today   Fara Olden, MD [facility] Phone Number: [phone] 10/25/2022 8:51 AM

## 2022-10-25 NOTE — Progress Notes (Addendum)
ANTICOAGULATION CONSULT NOTE - Follow Up Consult  Pharmacy Consult for transition of Heparin infusion to Apixaban Indication: DVT and acute PE   No Active Allergies  Patient Measurements: Height: 5' 7.5" (171.5 cm) Weight: 83.9 kg (184 lb 15.5 oz) IBW/kg (Calculated) : 62.75 Heparin dosing weight: 79.7 kg  Vital Signs: Temp: 98.8 F (37.1 C) (06/07 0339) Temp Source: Oral (06/07 0339) BP: 95/63 (06/07 0339) Pulse Rate: 83 (06/07 0339)  Labs: Recent Labs    10/23/22 0207 10/23/22 1215 10/24/22 0220 10/24/22 1044 10/24/22 1240 10/24/22 2111 10/25/22 0157  HGB 12.0  --  12.3  --   --   --  10.6*  HCT 36.0  --  37.6  --   --   --  32.4*  PLT 119*  --  PLATELET CLUMPS NOTED ON SMEAR, UNABLE TO ESTIMATE  --  147*  --  197  HEPARINUNFRC 0.15*   < > 0.51 0.44  --  0.53 0.46  CREATININE 0.74  --   --   --   --   --   --    < > = values in this interval not displayed.     Estimated Creatinine Clearance: 95.6 mL/min (by C-G formula based on SCr of 0.74 mg/dL).   Medical History: Past Medical History:  Diagnosis Date   Bipolar 1 disorder, depressed (HCC) 03/18/2012   Chronic hip pain    left hip. treated with radiofrequency   Depression    Lumbar degenerative disc disease 03/28/2017    Assessment: 52 y/o F transfer from outside facility with heparin infusing at 1490 units/hr for acute PE. Pt also has a LLE DVT. Pharmacy consulted to manage heparin infusion and then transition to apixaban on 10/25/22.  Heparin level 0.46, therapeutic for goal.  Heparin was not stopped for thrombectomy.  Current heparin infusion rate: 1950 units/hr  Hgb trended down to 10.6, Plt 197 - stable No issues noted with heparin infusion.  No bleeding post thrombectomy.   Goal of Therapy:  Heparin level 0.3-0.7 units/ml Monitor platelets by anticoagulation protocol: Yes   Plan:  Discontinue heparin infusion at the time of administering first dose of apixaban. Start apixaban 10mg  po BID x 7  days, then on 11/01/22 decrease to apixaban 5mg  po BID thereafter. Monitor daily CBC and for s/sx of bleeding Pharmacy to provide patient education on apixaban prior to discharge  Note: Patient had a presciption for apixaban partially filled at [facility] in [place] on 6/5. Copay at [facility] for 30 day supply was $60. Contacted pharmacy and asked them to put prescription on hold. Patient is eligible for first 30 days free and for co-pay assistance card with private insurance. Consider first fill be completed at [facility] pharmacy prior to discharge to help with copay assistance.  Wilburn Cornelia, PharmD, BCPS Clinical Pharmacist 10/25/2022 8:06 AM   Please refer to AMION for pharmacy phone number

## 2022-10-25 NOTE — Evaluation (Signed)
Physical Therapy Evaluation Patient Details Name: Jeanette Bell MRN: [id number] DOB: 06-18-1970 Today's Date: 10/25/2022  History of Present Illness  Patient is a 52 y/o female admitted 10/23/22 transfer from OSH with acute PE and L LE DVT.  She underwent percutaneous mechanical thrombectomy and stent to L iliofemoral vein on 10/24/22.  PMH positive for Lumbar DDD, bipolar d/o and chronic hip pain.  Clinical Impression  Patient presents with mobility limited by L LE and back pain following procedure.  She was able to ambulate in hallway with walker and negotiate stairs today appropriate for home entry.  Feel she is stable for d/c home with spouse assist and no follow up PT needs.  Has DME from family she can use.  PT signing off.        Recommendations for follow up therapy are one component of a multi-disciplinary discharge planning process, led by the attending physician.  Recommendations may be updated based on patient status, additional functional criteria and insurance authorization.  Follow Up Recommendations       Assistance Recommended at Discharge Set up Supervision/Assistance  Patient can return home with the following  Help with stairs or ramp for entrance;Assist for transportation;Assistance with cooking/housework    Equipment Recommendations None recommended by PT  Recommendations for Other Services       Functional Status Assessment Patient has had a recent decline in their functional status and demonstrates the ability to make significant improvements in function in a reasonable and predictable amount of time.     Precautions / Restrictions Precautions Precautions: Fall      Mobility  Bed Mobility Overal bed mobility: Modified Independent                  Transfers Overall transfer level: Needs assistance Equipment used: Rolling walker (2 wheels) Transfers: Sit to/from Stand Sit to Stand: Supervision           General transfer comment: cues for hand  placement    Ambulation/Gait Ambulation/Gait assistance: Supervision Gait Distance (Feet): 250 Feet Assistive device: Rolling walker (2 wheels) Gait Pattern/deviations: Step-to pattern, Step-through pattern, Decreased stride length, Antalgic       General Gait Details: decreased stance time on L and cues for posture, using RW for off loading  Stairs Stairs: Yes Stairs assistance: Min guard Stair Management: One rail Right, Step to pattern Number of Stairs: 3 General stair comments: hand hold on L due to rail too far, cues for sequence  Wheelchair Mobility    Modified Rankin (Stroke Patients Only)       Balance Overall balance assessment: Needs assistance   Sitting balance-Leahy Scale: Good       Standing balance-Leahy Scale: Good Standing balance comment: standing static without walker and walking short distance in room no AD                             Pertinent Vitals/Pain Pain Assessment Pain Assessment: 0-10 Pain Score: 7  Pain Location: back and L foot Pain Descriptors / Indicators: Aching, Throbbing Pain Intervention(s): Monitored during session, Patient requesting pain meds-RN notified    Home Living Family/patient expects to be discharged to:: Private residence Living Arrangements: Spouse/significant other Available Help at Discharge: Family Type of Home: House Home Access: Stairs to enter Entrance Stairs-Rails: Doctor, general practice of Steps: 4   Home Layout: One level Home Equipment: Agricultural consultant (2 wheels);Shower seat Additional Comments: mother has a RW and sister has  tub seat    Prior Function Prior Level of Function : Independent/Modified Independent             Mobility Comments: was working as Water quality scientist.       Higher education careers adviser        Extremity/Trunk Assessment   Upper Extremity Assessment Upper Extremity Assessment: Overall WFL for tasks assessed    Lower Extremity Assessment Lower Extremity  Assessment: LLE deficits/detail LLE Deficits / Details: wrapped with ace wrap and stiff with flexion with pain in L back LLE Sensation: decreased light touch (reports numbness more when up on the foot)       Communication   Communication: No difficulties  Cognition Arousal/Alertness: Awake/alert Behavior During Therapy: WFL for tasks assessed/performed Overall Cognitive Status: Within Functional Limits for tasks assessed                                          General Comments General comments (skin integrity, edema, etc.): VSS, mother present, discussed frequent short walks on level ground or flooring (5-7 walks) and gentle ROM of the leg    Exercises     Assessment/Plan    PT Assessment Patient does not need any further PT services  PT Problem List         PT Treatment Interventions      PT Goals (Current goals can be found in the Care Plan section)  Acute Rehab PT Goals PT Goal Formulation: All assessment and education complete, DC therapy    Frequency       Co-evaluation               AM-PAC PT "6 Clicks" Mobility  Outcome Measure Help needed turning from your back to your side while in a flat bed without using bedrails?: None Help needed moving from lying on your back to sitting on the side of a flat bed without using bedrails?: None Help needed moving to and from a bed to a chair (including a wheelchair)?: None Help needed standing up from a chair using your arms (e.g., wheelchair or bedside chair)?: None Help needed to walk in hospital room?: None Help needed climbing 3-5 steps with a railing? : A Little 6 Click Score: 23    End of Session   Activity Tolerance: Patient tolerated treatment well Patient left: in bed;with family/visitor present   PT Visit Diagnosis: Difficulty in walking, not elsewhere classified (R26.2)    Time: 0935-1000 PT Time Calculation (min) (ACUTE ONLY): 25 min   Charges:   PT Evaluation $PT Eval Low  Complexity: 1 Low PT Treatments $Gait Training: 8-22 mins        Sheran Lawless, PT Acute Rehabilitation Services [phone] 10/25/2022   Jeanette Bell 10/25/2022, 10:17 AM

## 2022-10-25 NOTE — TOC Transition Note (Signed)
Transition of Care (TOC) - CM/SW Discharge Note Donn Pierini RN, BSN Transitions of Care Unit 4E- RN Case Manager See Treatment Team for direct phone #   Patient Details  Name: Jeanette Bell MRN: [id number] Date of Birth: March 27, 1971  Transition of Care [facility]) CM/SW Contact:  Darrold Span, RN Phone Number: 10/25/2022, 2:05 PM   Clinical Narrative:    Admitted with PT/DVT- s/p Thrombectomy- Pt stable for transition home today, from home w/ spouse. CM had been notified by [facility] that [facility] had sent msg that they had received referral from office for [facility] needs- however per PT eval- no recommendations for [facility] follow up. Liaison for [facility] notified.   Per pt she has RW and shower chair at home- no new DME needs noted.  Family to transport home.   Pt has been started on Eliquis, copay $60- PharmD has seen pt and provided copay assist card to use, TOC pharmacy to fill first 30 day with trail coupon.   No further TOC needs noted.    Final next level of care: Home/Self Care Barriers to Discharge: No Barriers Identified   Patient Goals and CMS Choice   Choice offered to / list presented to : NA  Discharge Placement               Home          Discharge Plan and Services Additional resources added to the After Visit Summary for     Discharge Planning Services: CM Consult Post Acute Care Choice: NA          DME Arranged: N/A DME Agency: NA       HH Arranged: NA HH Agency: [facility]        Social Determinants of Health (SDOH) Interventions SDOH Screenings   Food Insecurity: Patient Declined (10/23/2022)  Housing: Patient Declined (10/23/2022)  Transportation Needs: No Transportation Needs (10/23/2022)  Utilities: Not At Risk (10/23/2022)  Financial Resource Strain: Low Risk  (04/03/2017)  Physical Activity: Inactive (04/03/2017)  Social Connections: Somewhat Isolated (04/03/2017)  Stress: Stress Concern Present (04/03/2017)  Tobacco Use: High Risk  (10/25/2022)     Readmission Risk Interventions    10/25/2022    2:05 PM  Readmission Risk Prevention Plan  Post Dischage Appt Complete  Medication Screening Complete  Transportation Screening Complete

## 2022-10-25 NOTE — Progress Notes (Signed)
    To whom it may concern,  Please excuse Ms. Jeanette Bell from work due to her recent procedure. She can return to work without restriction on 11/04/22.   Thank you,  Emilie Rutter, PA-C [facility] [phone] 10/25/2022  7:53 AM

## 2022-10-25 NOTE — Discharge Instructions (Addendum)
[facility]  Discharge Instructions  Lower Extremity Angiogram; Angioplasty/Stenting  Please refer to the following instructions for your post-procedure care. Your surgeon or physician assistant will discuss any changes with you.  Activity  Avoid lifting more than 8 pounds (1 gallons of milk) for 72 hours (3 days) after your procedure. You may walk as much as you can tolerate. It's OK to drive after 72 hours.  Bathing/Showering  You may shower the day after your procedure. If you have a bandage, you may remove it at 24- 48 hours. Clean your incision site with mild soap and water. Pat the area dry with a clean towel.  Diet  Resume your pre-procedure diet. There are no special food restrictions following this procedure. All patients with peripheral vascular disease should follow a low fat/low cholesterol diet. In order to heal from your surgery, it is CRITICAL to get adequate nutrition. Your body requires vitamins, minerals, and protein. Vegetables are the best source of vitamins and minerals. Vegetables also provide the perfect balance of protein. Processed food has little nutritional value, so try to avoid this.  Medications  Resume taking all of your medications unless your doctor tells you not to. If your incision is causing pain, you may take over-the-counter pain relievers such as acetaminophen (Tylenol)  Follow Up  Follow up will be arranged at the time of your procedure. You may have an office visit scheduled or may be scheduled for surgery. Ask your surgeon if you have any questions.  Please call us immediately for any of the following conditions: Severe or worsening pain your legs or feet at rest or with walking. Increased pain, redness, drainage at your groin puncture site. Fever of 101 degrees or higher. If you have any mild or slow bleeding from your puncture site: lie down, apply firm constant pressure over the area with a piece of gauze  or a clean wash cloth for 30 minutes- no peeking!, call 911 right away if you are still bleeding after 30 minutes, or if the bleeding is heavy and unmanageable.  Reduce your risk factors of vascular disease:  Stop smoking. If you would like help call [facility] at 1-800-QUIT-NOW [phone]) 317-4848) or [place] at [phone]. Manage your cholesterol Maintain a desired weight Control your diabetes Keep your blood pressure down  If you have any questions, please call the office at [phone]  _________________________________________________________________________________________________________________________________ Information on my medicine - ELIQUIS (apixaban)  This medication education was reviewed with me or my healthcare representative as part of my discharge preparation.  The pharmacist that spoke with me during my hospital stay was:  Doristine Counter, [facility]  Why was Eliquis prescribed for you? Eliquis was prescribed to treat blood clots that may have been found in the veins of your legs (deep vein thrombosis) or in your lungs (pulmonary embolism) and to reduce the risk of them occurring again.  What do You need to know about Eliquis ? The starting dose is 10 mg (two 5 mg tablets) taken TWICE daily for the FIRST SEVEN (7) DAYS, then on FRIDAY 11/01/22  the dose is reduced to ONE 5 mg tablet taken TWICE daily.  Eliquis may be taken with or without food.   Try to take the dose about the same time in the morning and in the evening. If you have difficulty swallowing the tablet whole please discuss with your pharmacist how to take the medication safely.  Take Eliquis exactly as prescribed and DO NOT stop taking  Eliquis without talking to the doctor who prescribed the medication.  Stopping may increase your risk of developing a new blood clot.  Refill your prescription before you run out.  After discharge, you should have regular check-up appointments with your healthcare provider  that is prescribing your Eliquis.    What do you do if you miss a dose? If a dose of ELIQUIS is not taken at the scheduled time, take it as soon as possible on the same day and twice-daily administration should be resumed. The dose should not be doubled to make up for a missed dose.  Important Safety Information A possible side effect of Eliquis is bleeding. You should call your healthcare provider right away if you experience any of the following: Bleeding from an injury or your nose that does not stop. Unusual colored urine (red or dark brown) or unusual colored stools (red or black). Unusual bruising for unknown reasons. A serious fall or if you hit your head (even if there is no bleeding).  Some medicines may interact with Eliquis and might increase your risk of bleeding or clotting while on Eliquis. To help avoid this, consult your healthcare provider or pharmacist prior to using any new prescription or non-prescription medications, including herbals, vitamins, non-steroidal anti-inflammatory drugs (NSAIDs) and supplements.  This website has more information on Eliquis (apixaban): http://www.eliquis.com/eliquis/home

## 2022-10-25 NOTE — TOC Benefit Eligibility Note (Signed)
Patient Product/process development scientist completed.    The patient is currently admitted and upon discharge could be taking Eliquis 5 mg.  The current 30 day co-pay is $60.00.   The patient is insured through [employer]   This test claim was processed through [employer]- copay amounts may vary at other pharmacies due to [employer], or as the patient moves through the different stages of their insurance plan.  Roland Earl, CPHT Pharmacy Patient Advocate Specialist [facility] Pharm[facility] Patient Advocate Team Direct Number: [phone]  Fax: [phone]

## 2022-10-28 ENCOUNTER — Telehealth: Payer: Self-pay | Admitting: Physician Assistant

## 2022-10-28 NOTE — Telephone Encounter (Signed)
-----   Message from Emilie Rutter, PA-C sent at 10/25/2022  7:41 AM EDT -----  4-6 weeks with IVC/L iliac venous duplex and L leg venous duplex with Dr. Karin Lieu or PA.  PO L iliac vein thrombectomy and stent placement for May Thurner

## 2022-11-04 NOTE — Telephone Encounter (Signed)
Appt has been scheduled.

## 2022-11-05 ENCOUNTER — Telehealth: Payer: Managed Care, Other (non HMO) | Admitting: Physician Assistant

## 2022-11-05 DIAGNOSIS — M7989 Other specified soft tissue disorders: Secondary | ICD-10-CM

## 2022-11-05 NOTE — Progress Notes (Signed)
Because of recent medical issues and procedure with this now developing, I feel your condition warrants further evaluation and I recommend that you be seen in a face to face visit. We recommend you first reach out to the surgeon who did your procedure for further evaluation and proper care.    NOTE: There will be NO CHARGE for this eVisit   If you are having a true medical emergency please call 911.      For an urgent face to face visit, [facility] has eight urgent care centers for your convenience:   NEW!! [facility] Health Urgent Care Center at [facility] Get Driving Directions [phone] [address], [address] [place], [address]    [facility] Health Urgent Care Center at [facility] Get Driving Directions [phone] [address] [place], [place] [address]   [facility] Health Urgent Care Center [facility]) Get Driving Directions [phone] [address] [place], [place] [address]  [facility] Health Urgent Care Center [facility]) Get Driving Directions [phone] [address] [place],  [place]  [address]  [facility] Health Urgent Care Center [facility] - at [employer]  [phone] [address] [place],  [place] [address]   [facility] Health Urgent Care at [facility] Get Driving Directions [phone] [address] [place], [place] [address]   [facility] Health Urgent Care at [facility] Get Driving Directions  [phone] [address] [place], [place] [address]   [facility] Health Urgent Care at [facility] Directions [phone] [address], [address] [place], [place] [address]  Your MyChart E-visit questionnaire answers were reviewed by a board certified advanced clinical practitioner to complete your personal care plan based on your specific symptoms.  Thank you for using e-Visits.

## 2022-11-08 ENCOUNTER — Telehealth: Payer: Self-pay

## 2022-11-08 NOTE — Telephone Encounter (Signed)
Pt called with questions regarding the recent DVT and PE dx.  Reviewed pt's chart, returned call for clarification, two identifiers used. Pt has had continued SOB and R sided pleural pain, significantly increased over the past couple of days. Reassured her, educated her on the importance of taking her anticoagulant, and gave some tips on compression stockings. Confirmed understanding.

## 2022-11-12 ENCOUNTER — Other Ambulatory Visit: Payer: Self-pay

## 2022-11-12 MED ORDER — APIXABAN 5 MG PO TABS: 5.0000 mg | ORAL_TABLET | Freq: Two times a day (BID) | ORAL | 1 refills | Status: DC

## 2022-11-12 NOTE — Telephone Encounter (Signed)
Eliquis starter pack to be completed and pt requested prescription for Eliquis 5 mg BID to be sent to [facility] in [place].   E-scribed

## 2022-11-29 ENCOUNTER — Other Ambulatory Visit: Payer: Self-pay | Admitting: *Deleted

## 2022-11-29 DIAGNOSIS — I82402 Acute embolism and thrombosis of unspecified deep veins of left lower extremity: Secondary | ICD-10-CM

## 2022-12-20 ENCOUNTER — Other Ambulatory Visit: Payer: Self-pay

## 2022-12-20 ENCOUNTER — Ambulatory Visit (INDEPENDENT_AMBULATORY_CARE_PROVIDER_SITE_OTHER): Payer: Managed Care, Other (non HMO) | Admitting: Vascular Surgery

## 2022-12-20 ENCOUNTER — Ambulatory Visit (HOSPITAL_COMMUNITY)
Admission: RE | Admit: 2022-12-20 | Discharge: 2022-12-20 | Disposition: A | Discharge status: Home / Self Care | Payer: Managed Care, Other (non HMO) | Source: Ambulatory Visit | Attending: Vascular Surgery | Admitting: Vascular Surgery

## 2022-12-20 ENCOUNTER — Ambulatory Visit (INDEPENDENT_AMBULATORY_CARE_PROVIDER_SITE_OTHER)
Admission: RE | Admit: 2022-12-20 | Discharge: 2022-12-20 | Disposition: A | Discharge status: Home / Self Care | Payer: Managed Care, Other (non HMO) | Source: Ambulatory Visit | Attending: Vascular Surgery | Admitting: Vascular Surgery

## 2022-12-20 ENCOUNTER — Encounter: Payer: Self-pay | Admitting: Vascular Surgery

## 2022-12-20 VITALS — BP 126/83 | HR 63 | Temp 98.6°F | Resp 20 | Ht 67.5 in | Wt 177.0 lb

## 2022-12-20 DIAGNOSIS — I871 Compression of vein: Secondary | ICD-10-CM

## 2022-12-20 DIAGNOSIS — I82402 Acute embolism and thrombosis of unspecified deep veins of left lower extremity: Secondary | ICD-10-CM | POA: Diagnosis not present
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
## 2023-01-09 ENCOUNTER — Encounter: Payer: Managed Care, Other (non HMO) | Admitting: Vascular Surgery

## 2023-01-16 ENCOUNTER — Other Ambulatory Visit: Payer: Self-pay | Admitting: Oncology

## 2023-01-16 DIAGNOSIS — Z006 Encounter for examination for normal comparison and control in clinical research program: Secondary | ICD-10-CM

## 2023-01-21 ENCOUNTER — Other Ambulatory Visit: Payer: Self-pay | Admitting: Vascular Surgery

## 2023-01-24 ENCOUNTER — Encounter: Payer: Self-pay | Admitting: Vascular Surgery

## 2023-01-27 ENCOUNTER — Ambulatory Visit: Payer: Managed Care, Other (non HMO) | Admitting: Vascular Surgery

## 2023-01-27 DIAGNOSIS — Z09 Encounter for follow-up examination after completed treatment for conditions other than malignant neoplasm: Secondary | ICD-10-CM | POA: Diagnosis not present

## 2023-01-27 DIAGNOSIS — I871 Compression of vein: Secondary | ICD-10-CM

## 2023-01-27 DIAGNOSIS — Z8679 Personal history of other diseases of the circulatory system: Secondary | ICD-10-CM
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
## 2023-02-04 NOTE — Progress Notes (Signed)
A user error has taken place: encounter opened in error, closed for administrative reasons.

## 2023-04-02 ENCOUNTER — Encounter: Payer: Self-pay | Admitting: Vascular Surgery

## 2023-07-17 ENCOUNTER — Encounter (HOSPITAL_COMMUNITY): Payer: Self-pay

## 2023-07-17 ENCOUNTER — Ambulatory Visit (HOSPITAL_COMMUNITY)
Admission: EM | Admit: 2023-07-17 | Discharge: 2023-07-17 | Disposition: A | Discharge status: Home / Self Care | Payer: Managed Care, Other (non HMO) | Attending: Family Medicine | Admitting: Family Medicine

## 2023-07-17 DIAGNOSIS — H66002 Acute suppurative otitis media without spontaneous rupture of ear drum, left ear: Secondary | ICD-10-CM | POA: Diagnosis not present

## 2023-07-17 DIAGNOSIS — R030 Elevated blood-pressure reading, without diagnosis of hypertension: Secondary | ICD-10-CM

## 2023-07-17 DIAGNOSIS — H9202 Otalgia, left ear: Secondary | ICD-10-CM | POA: Diagnosis not present

## 2023-07-17 DIAGNOSIS — T162XXA Foreign body in left ear, initial encounter: Secondary | ICD-10-CM

## 2023-07-17 MED ORDER — AMOXICILLIN 875 MG PO TABS: 875.0000 mg | ORAL_TABLET | Freq: Two times a day (BID) | ORAL | 0 refills | Status: AC

## 2023-07-17 MED ORDER — HYDROCODONE-ACETAMINOPHEN 5-325 MG PO TABS: 1.0000 | ORAL_TABLET | Freq: Four times a day (QID) | ORAL | 0 refills | Status: DC | PRN

## 2023-07-17 NOTE — ED Triage Notes (Addendum)
 Pt states yesterday had lt ear ache radiating down to jaw. Today pain radiating down lt arm. States today having dizziness, headache, and feels like someone pulling the lt side of her face down. Denies CP or SOB. No facial droop or drift noted. C/o tingling to both feet. Hx of blood clot to lt femoral vein 8months ago.

## 2023-07-17 NOTE — Discharge Instructions (Signed)
 Be aware, you have been prescribed pain medications that may cause drowsiness. While taking this medication, do not take any other medications containing acetaminophen (Tylenol). Do not combine with alcohol or recreational drugs. Please do not drive, operate heavy machinery, or take part in activities that require making important decisions while on this medication as your judgement may be clouded.
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
## 2023-12-15 ENCOUNTER — Other Ambulatory Visit: Payer: Self-pay

## 2023-12-15 ENCOUNTER — Other Ambulatory Visit: Payer: Self-pay | Admitting: Vascular Surgery

## 2023-12-15 DIAGNOSIS — I871 Compression of vein: Secondary | ICD-10-CM
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
## 2023-12-31 ENCOUNTER — Other Ambulatory Visit: Payer: Self-pay

## 2023-12-31 DIAGNOSIS — I82402 Acute embolism and thrombosis of unspecified deep veins of left lower extremity: Secondary | ICD-10-CM

## 2024-01-01 ENCOUNTER — Encounter: Payer: Self-pay | Admitting: Vascular Surgery

## 2024-01-01 ENCOUNTER — Ambulatory Visit (HOSPITAL_BASED_OUTPATIENT_CLINIC_OR_DEPARTMENT_OTHER)
Admission: RE | Admit: 2024-01-01 | Discharge: 2024-01-01 | Disposition: A | Discharge status: Home / Self Care | Source: Ambulatory Visit | Attending: Vascular Surgery | Admitting: Vascular Surgery

## 2024-01-01 ENCOUNTER — Ambulatory Visit (HOSPITAL_COMMUNITY)
Admission: RE | Admit: 2024-01-01 | Discharge: 2024-01-01 | Disposition: A | Discharge status: Home / Self Care | Source: Ambulatory Visit | Attending: Vascular Surgery | Admitting: Vascular Surgery

## 2024-01-01 ENCOUNTER — Ambulatory Visit (INDEPENDENT_AMBULATORY_CARE_PROVIDER_SITE_OTHER): Admitting: Vascular Surgery

## 2024-01-01 VITALS — BP 111/78 | HR 65 | Temp 98.3°F | Resp 16 | Ht 67.5 in | Wt 185.9 lb

## 2024-01-01 DIAGNOSIS — I82402 Acute embolism and thrombosis of unspecified deep veins of left lower extremity: Secondary | ICD-10-CM

## 2024-01-01 DIAGNOSIS — I871 Compression of vein: Secondary | ICD-10-CM | POA: Insufficient documentation

## 2024-01-01 DIAGNOSIS — Z86718 Personal history of other venous thrombosis and embolism: Secondary | ICD-10-CM | POA: Insufficient documentation

## 2024-02-27 ENCOUNTER — Encounter: Payer: Self-pay | Admitting: Podiatry

## 2024-02-27 ENCOUNTER — Ambulatory Visit: Admitting: Podiatry

## 2024-02-27 VITALS — Ht 67.5 in | Wt 185.9 lb

## 2024-02-27 DIAGNOSIS — B351 Tinea unguium: Secondary | ICD-10-CM | POA: Diagnosis not present

## 2024-02-27 DIAGNOSIS — M216X1 Other acquired deformities of right foot: Secondary | ICD-10-CM

## 2024-02-27 DIAGNOSIS — M216X2 Other acquired deformities of left foot: Secondary | ICD-10-CM | POA: Diagnosis not present

## 2024-02-27 DIAGNOSIS — Q667 Congenital pes cavus, unspecified foot: Secondary | ICD-10-CM

## 2024-02-27 MED ORDER — TERBINAFINE HCL 250 MG PO TABS: 250.0000 mg | ORAL_TABLET | Freq: Every day | ORAL | 0 refills | Status: DC

## 2024-02-27 NOTE — Progress Notes (Signed)
   Chief Complaint  Patient presents with   Nail Problem    Pt is here due to bilateral 1st and 2nd digits the nails are thick and curved, wants to know what can be done.    Subjective: 53 y.o. female presenting today for evaluation of discoloration with thickening to the toenails bilateral.  Ongoing for about 3-4 years now.  She believes it initially began with a pedicure.  Now the toes are thick and disfigured.  She has tried topical OTC antifungal with no improvement.  Past Medical History:  Diagnosis Date   Bipolar 1 disorder, depressed (HCC) 03/18/2012   Chronic hip pain    left hip. treated with radiofrequency   Depression    Lumbar degenerative disc disease 03/28/2017    Past Surgical History:  Procedure Laterality Date   ABDOMINAL HYSTERECTOMY     uterine ablatione (removal of lining of uterus)   BACK SURGERY     c cection     ESOPHAGOGASTRODUODENOSCOPY N/A 03/19/2015   Procedure: ESOPHAGOGASTRODUODENOSCOPY (EGD);  Surgeon: Deward CINDERELLA Piedmont, MD;  Location: [facility] ENDOSCOPY;  Service: Gastroenterology;  Laterality: N/A;   ESOPHAGOGASTRODUODENOSCOPY N/A 03/06/2022   Procedure: ESOPHAGOGASTRODUODENOSCOPY (EGD);  Surgeon: [place], Ladell POUR, MD;  Location: [facility] ENDOSCOPY;  Service: Gastroenterology;  Laterality: N/A;   LOWER EXTREMITY VENOGRAPHY N/A 10/24/2022   Procedure: LOWER EXTREMITY VENOGRAPHY;  Surgeon: Lanis Fonda BRAVO, MD;  Location: [facility] INVASIVE CV LAB;  Service: Cardiovascular;  Laterality: N/A;   PERIPHERAL VASCULAR THROMBECTOMY Left 10/24/2022   Procedure: PERIPHERAL VASCULAR THROMBECTOMY;  Surgeon: Lanis Fonda BRAVO, MD;  Location: [facility] INVASIVE CV LAB;  Service: Cardiovascular;  Laterality: Left;    No Known Allergies  Objective: Physical Exam General: The patient is alert and oriented x3 in no acute distress.  Dermatology: Hyperkeratotic, discolored, thickened, onychodystrophy noted. Skin is warm, dry and supple bilateral lower extremities. Negative for open lesions or  macerations.  Vascular: Palpable pedal pulses bilaterally. No edema or erythema noted. Capillary refill within normal limits.  Neurological: Grossly intact via light touch  Musculoskeletal Exam: high arches noted with weightbearing.  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 -Today we discussed different treatment options including oral, topical, and laser antifungal treatment modalities.  We discussed their efficacies and side effects.  Patient opts for oral antifungal treatment modality -Prescription for Lamisil 250 mg #90 daily. Pt denies a history of liver pathology or symptoms.  Patient is otherwise healthy -To address the metatarsalgia to the bilateral feet as well as generalized pain due to ambulation secondary to the high arches I do believe that orthotics would help to alleviate a lot of the patient's symptoms and support the medial longitudinal arch of the foot. -Appointment with orthotics department for custom molded insoles -Return to clinic with me PRN   Thresa EMERSON Sar, DPM [facility]  Dr. Thresa EMERSON Sar, DPM    [address] [place], [place] [address]                Office [phone]  Fax [phone]

## 2024-03-09 ENCOUNTER — Other Ambulatory Visit: Payer: Self-pay | Admitting: Medical Genetics

## 2024-03-09 DIAGNOSIS — Z006 Encounter for examination for normal comparison and control in clinical research program: Secondary | ICD-10-CM

## 2024-03-11 ENCOUNTER — Ambulatory Visit

## 2024-03-11 NOTE — Progress Notes (Signed)
 Orthotics   Patient was present and evaluated for Custom molded foot orthotics. Patient will benefit from CFO's to provide total contact to BIL MLA's helping to balance and distribute body weight more evenly across BIL feet helping to reduce plantar pressure and pain. Orthotic will also encourage FF / RF alignment  Patient was scanned today and will return for fitting upon receipt  Ded is met for the year 20% coins remains   Lolita Schultze CPed, Cfo, CFm

## 2024-04-06 ENCOUNTER — Telehealth: Payer: Self-pay

## 2024-04-06 NOTE — Telephone Encounter (Signed)
 Called patient to schedule an appointment to [facility]. Unable to connect with patient. LVM asking patient to call us  back so that way we can schedule PUO appointment.

## 2024-04-20 ENCOUNTER — Telehealth: Payer: Self-pay | Admitting: Medical Genetics

## 2024-04-20 LAB — GENECONNECT MOLECULAR SCREEN

## 2024-04-22 NOTE — Telephone Encounter (Signed)
 [facility]  04/22/2024 9:23 AM  Confirmed I was speaking with Jeanette Bell [phone] by using name and DOB. Informed participant the reason for this call is to follow-up on a recent sample the participant provided at one of the [facility] lab locations. Informed participant the test was not able to be completed with this sample and apologized for the inconvenience. Participant was requested to provide a new sample at one of our participating labs at no cost so that participant can continue participation and receive test results. Informed participant they do not need to be fasting and if there are other samples that need to be drawn, they can be done at the same visit. Participant has not had a blood transfusion or blood product in the last 30 days. Participant agreed to provide another sample. Participant was provided the [employer] program website to learn why this may have happened. Participant was thanked for their time and continued support of the above study.    Jordyn Pennstrom, BS [place]  [facility] Clinical Research Specialist II Direct Dial: [phone]  Fax: [phone]

## 2024-04-28 ENCOUNTER — Encounter

## 2024-04-29 DIAGNOSIS — M216X1 Other acquired deformities of right foot: Secondary | ICD-10-CM | POA: Diagnosis not present

## 2024-04-29 DIAGNOSIS — M216X2 Other acquired deformities of left foot: Secondary | ICD-10-CM | POA: Diagnosis not present

## 2024-06-02 ENCOUNTER — Other Ambulatory Visit: Payer: Self-pay | Admitting: Podiatry

## 2024-06-22 ENCOUNTER — Other Ambulatory Visit: Payer: Self-pay | Admitting: Podiatry

## 2024-06-22 ENCOUNTER — Telehealth: Payer: Self-pay | Admitting: Podiatry

## 2024-06-22 DIAGNOSIS — Z79899 Other long term (current) drug therapy: Secondary | ICD-10-CM

## 2024-06-22 NOTE — Progress Notes (Signed)
 LFT ordered prior to refill medication

## 2024-06-22 NOTE — Telephone Encounter (Signed)
 Patient called wanting a refill of her lamisil . Patient wants her lab slip sent to [facility] because she works there. Dr Janit sent lab slip if Liver funtion is normal he will send over her RX to pharmacy. Called patient back let her know RX sent to [facility].

## 2024-06-23 LAB — HEPATIC FUNCTION PANEL
AG Ratio: 1.4 (calc) (ref 1.0–2.5)
ALT: 20 U/L (ref 6–29)
AST: 22 U/L (ref 10–35)
Albumin: 4.9 g/dL (ref 3.6–5.1)
Alkaline phosphatase (APISO): 136 U/L (ref 37–153)
Bilirubin, Direct: 0.1 mg/dL (ref 0.0–0.2)
Globulin: 3.4 g/dL (ref 1.9–3.7)
Indirect Bilirubin: 0.3 mg/dL (ref 0.2–1.2)
Total Bilirubin: 0.4 mg/dL (ref 0.2–1.2)
Total Protein: 8.3 g/dL — ABNORMAL HIGH (ref 6.1–8.1)

## 2024-06-24 ENCOUNTER — Other Ambulatory Visit: Payer: Self-pay | Admitting: Podiatry

## 2024-06-24 MED ORDER — TERBINAFINE HCL 250 MG PO TABS: 250.0000 mg | ORAL_TABLET | Freq: Every day | ORAL | 0 refills | Status: AC
